# Patient Record
Sex: Female | Born: 1975 | Race: Black or African American | Hispanic: No | Smoking: Never smoker
Health system: Southern US, Community
[De-identification: ages and names within clinical notes are randomized; demographics above are authoritative.]

## PROBLEM LIST (undated history)

## (undated) DIAGNOSIS — I10 Essential (primary) hypertension: Secondary | ICD-10-CM

## (undated) DIAGNOSIS — A599 Trichomoniasis, unspecified: Secondary | ICD-10-CM

---

## 1998-05-24 ENCOUNTER — Emergency Department (HOSPITAL_COMMUNITY): Admission: EM | Admit: 1998-05-24 | Discharge: 1998-05-24 | Payer: Self-pay | Admitting: Emergency Medicine

## 1999-08-23 ENCOUNTER — Encounter: Payer: Self-pay | Admitting: Emergency Medicine

## 1999-08-23 ENCOUNTER — Emergency Department (HOSPITAL_COMMUNITY): Admission: EM | Admit: 1999-08-23 | Discharge: 1999-08-23 | Payer: Self-pay | Admitting: Emergency Medicine

## 2000-06-04 ENCOUNTER — Emergency Department (HOSPITAL_COMMUNITY): Admission: EM | Admit: 2000-06-04 | Discharge: 2000-06-04 | Payer: Self-pay | Admitting: Emergency Medicine

## 2001-03-18 ENCOUNTER — Emergency Department (HOSPITAL_COMMUNITY): Admission: EM | Admit: 2001-03-18 | Discharge: 2001-03-18 | Payer: Self-pay | Admitting: Emergency Medicine

## 2002-12-24 HISTORY — PX: DILATION AND CURETTAGE OF UTERUS: SHX78

## 2010-10-04 ENCOUNTER — Emergency Department (HOSPITAL_COMMUNITY)
Admission: EM | Admit: 2010-10-04 | Discharge: 2010-10-04 | Payer: Self-pay | Source: Home / Self Care | Admitting: Family Medicine

## 2010-10-24 ENCOUNTER — Emergency Department (HOSPITAL_COMMUNITY): Admission: EM | Admit: 2010-10-24 | Discharge: 2010-10-24 | Payer: Self-pay | Admitting: Emergency Medicine

## 2011-02-14 ENCOUNTER — Encounter: Payer: Self-pay | Admitting: Obstetrics and Gynecology

## 2011-06-02 ENCOUNTER — Emergency Department (HOSPITAL_COMMUNITY)
Admission: EM | Admit: 2011-06-02 | Discharge: 2011-06-02 | Disposition: A | Discharge status: Home / Self Care | Payer: PRIVATE HEALTH INSURANCE | Attending: Emergency Medicine | Admitting: Emergency Medicine

## 2011-06-02 DIAGNOSIS — M79609 Pain in unspecified limb: Secondary | ICD-10-CM | POA: Insufficient documentation

## 2011-06-02 DIAGNOSIS — IMO0002 Reserved for concepts with insufficient information to code with codable children: Secondary | ICD-10-CM | POA: Insufficient documentation

## 2011-06-02 DIAGNOSIS — E669 Obesity, unspecified: Secondary | ICD-10-CM | POA: Insufficient documentation

## 2011-06-05 ENCOUNTER — Inpatient Hospital Stay (INDEPENDENT_AMBULATORY_CARE_PROVIDER_SITE_OTHER)
Admission: RE | Admit: 2011-06-05 | Discharge: 2011-06-05 | Disposition: A | Discharge status: Home / Self Care | Payer: PRIVATE HEALTH INSURANCE | Source: Ambulatory Visit | Attending: Emergency Medicine | Admitting: Emergency Medicine

## 2011-06-05 DIAGNOSIS — IMO0002 Reserved for concepts with insufficient information to code with codable children: Secondary | ICD-10-CM

## 2011-08-20 ENCOUNTER — Inpatient Hospital Stay (INDEPENDENT_AMBULATORY_CARE_PROVIDER_SITE_OTHER)
Admission: RE | Admit: 2011-08-20 | Discharge: 2011-08-20 | Disposition: A | Discharge status: Home / Self Care | Payer: Self-pay | Source: Ambulatory Visit | Attending: Family Medicine | Admitting: Family Medicine

## 2011-08-20 DIAGNOSIS — J069 Acute upper respiratory infection, unspecified: Secondary | ICD-10-CM

## 2011-08-20 DIAGNOSIS — R05 Cough: Secondary | ICD-10-CM

## 2012-01-04 ENCOUNTER — Ambulatory Visit (INDEPENDENT_AMBULATORY_CARE_PROVIDER_SITE_OTHER): Payer: BC Managed Care – PPO | Admitting: General Surgery

## 2012-01-17 ENCOUNTER — Ambulatory Visit (INDEPENDENT_AMBULATORY_CARE_PROVIDER_SITE_OTHER): Payer: BC Managed Care – PPO | Admitting: General Surgery

## 2012-02-15 ENCOUNTER — Emergency Department (INDEPENDENT_AMBULATORY_CARE_PROVIDER_SITE_OTHER)
Admission: EM | Admit: 2012-02-15 | Discharge: 2012-02-15 | Disposition: A | Discharge status: Home Health | Payer: BC Managed Care – PPO | Source: Home / Self Care | Attending: Emergency Medicine | Admitting: Emergency Medicine

## 2012-02-15 ENCOUNTER — Encounter (HOSPITAL_COMMUNITY): Payer: Self-pay | Admitting: *Deleted

## 2012-02-15 DIAGNOSIS — R51 Headache: Secondary | ICD-10-CM

## 2012-02-15 DIAGNOSIS — H60399 Other infective otitis externa, unspecified ear: Secondary | ICD-10-CM

## 2012-02-15 DIAGNOSIS — H60391 Other infective otitis externa, right ear: Secondary | ICD-10-CM

## 2012-02-15 MED ORDER — ACETAMINOPHEN 325 MG PO TABS: 975.0000 mg | ORAL_TABLET | Freq: Once | ORAL | Status: DC

## 2012-02-15 MED ORDER — ACETAMINOPHEN 325 MG PO TABS
ORAL_TABLET | ORAL | Status: AC
  Filled 2012-02-15: qty 3

## 2012-02-15 MED ORDER — ACETAMINOPHEN-CODEINE #3 300-30 MG PO TABS: 1.0000 | ORAL_TABLET | Freq: Four times a day (QID) | ORAL | Status: AC | PRN

## 2012-02-15 MED ORDER — CIPROFLOXACIN-HYDROCORTISONE 0.2-1 % OT SUSP: 3.0000 [drp] | Freq: Two times a day (BID) | OTIC | Status: AC

## 2012-02-15 NOTE — ED Notes (Signed)
Pt is here with complaints of right ear pain and HA with onset last night.

## 2012-02-15 NOTE — ED Provider Notes (Signed)
History     CSN: [id number]  Arrival date & time 02/15/12  6213   First MD Initiated Contact with Patient 02/15/12 [phone]      Chief Complaint  Patient presents with  . Otalgia    (Consider location/radiation/quality/duration/timing/severity/associated sxs/prior treatment) HPI Comments: Patient presents today to urgent care complaining of right ear pain that started last night this morning woke up with a headache (global), feels like her ear is warm and is tender pupils are ear denies any injury to his or recent complaining that has been applying peroxide to her right ear canal.  Patient describes that last week she did have some cold-like symptoms consistent of cough and congestion currently with a mild improvement runny and stuffy nose. No fevers, no shortness of breath.  Patient denies any other symptoms such as numbness, tingling, sudden weakness of any extremity, visual changes.  Patient is a 36 y.o. female presenting with ear pain. The history is provided by the patient.  Otalgia This is a new problem. The current episode started yesterday. There is pain in the right ear. The problem occurs constantly. The problem has been gradually worsening. There has been no fever. Associated symptoms include headaches, rhinorrhea and cough. Pertinent negatives include no hearing loss, no sore throat, no abdominal pain and no neck pain. Her past medical history is significant for tympanostomy tube. Her past medical history does not include chronic ear infection or hearing loss.    History reviewed. No pertinent past medical history.  History reviewed. No pertinent past surgical history.  History reviewed. No pertinent family history.  History  Substance Use Topics  . Smoking status: Never Smoker   . Smokeless tobacco: Not on file  . Alcohol Use: No    OB History    Grav Para Term Preterm Abortions TAB SAB Ect Mult Living                  Review of Systems  Constitutional: Negative  for fever, appetite change and fatigue.  HENT: Positive for ear pain, congestion and rhinorrhea. Negative for hearing loss, sore throat and neck pain.   Eyes: Negative for pain.  Respiratory: Positive for cough.   Gastrointestinal: Negative for abdominal pain.  Neurological: Positive for headaches. Negative for dizziness, facial asymmetry, weakness, light-headedness and numbness.    Allergies  Review of patient's allergies indicates no known allergies.  Home Medications   Current Outpatient Rx  Name Route Sig Dispense Refill  . ACETAMINOPHEN-CODEINE #3 300-30 MG PO TABS Oral Take 1-2 tablets by mouth every 6 (six) hours as needed for pain. 15 tablet 0  . CIPROFLOXACIN-HYDROCORTISONE 0.2-1 % OT SUSP Right Ear Place 3 drops into the right ear 2 (two) times daily. 10 mL 0    BP 143/82  Pulse 80  Temp(Src) 98.4 F (36.9 C) (Oral)  Resp 16  SpO2 99%  Physical Exam  Nursing note and vitals reviewed. Constitutional: She appears well-developed and well-nourished. No distress.  HENT:  Head: Normocephalic.  Right Ear: Tympanic membrane normal. No lacerations. There is drainage and tenderness. No foreign bodies. No mastoid tenderness. Tympanic membrane is not injected and not bulging.  Left Ear: Hearing, tympanic membrane, external ear and ear canal normal.  Eyes: Conjunctivae are normal. Right eye exhibits no discharge. Left eye exhibits no discharge.  Neck: Neck supple. No JVD present.  Cardiovascular:  No murmur heard. Pulmonary/Chest: Effort normal. She has no wheezes.  Abdominal: Soft.  Lymphadenopathy:    She has no cervical adenopathy.  Neurological: She is alert.  Skin: No rash noted.    ED Course  Procedures (including critical care time)  Labs Reviewed - No data to display No results found.   1. Infection of ear, external, right   2. Headache       MDM   Right ear discomfort for less than 24 hours with an associated headache. Afebrile denies ear canal  injured. The lateral tympanic membranes looked unremarkable. Right ear canal with active exudates tender with traction.       Jimmie Molly, MD 02/15/12 [phone]

## 2012-02-15 NOTE — Discharge Instructions (Signed)
Otitis Externa Otitis externa ("swimmer's ear") is a germ (bacterial) or fungal infection of the outer ear canal (from the eardrum to the outside of the ear). Swimming in dirty water may cause swimmer's ear. It also may be caused by moisture in the ear from water remaining after swimming or bathing. Often the first signs of infection may be itching in the ear canal. This may progress to ear canal swelling, redness, and pus drainage, which may be signs of infection. HOME CARE INSTRUCTIONS   Apply the antibiotic drops to the ear canal as prescribed by your doctor.   This can be a very painful medical condition. A strong pain reliever may be prescribed.   Only take over-the-counter or prescription medicines for pain, discomfort, or fever as directed by your caregiver.   If your caregiver has given you a follow-up appointment, it is very important to keep that appointment. Not keeping the appointment could result in a chronic or permanent injury, pain, hearing loss and disability. If there is any problem keeping the appointment, you must call back to this facility for assistance.  PREVENTION   It is important to keep your ear dry. Use the corner of a towel to wick water out of the ear canal after swimming or bathing.   Avoid scratching in your ear. This can damage the ear canal or remove the protective wax lining the canal and make it easier for germs (bacteria) or a fungus to grow.   You may use ear drops made of rubbing alcohol and vinegar after swimming to prevent future "swimmer's ear" infections. Make up a small bottle of equal parts white vinegar and alcohol. Put 3 or 4 drops into each ear after swimming.   Avoid swimming in lakes, polluted water, or poorly chlorinated pools.  SEEK MEDICAL CARE IF:   An oral temperature above 102 F (38.9 C) develops.   Your ear is still painful after 3 days and shows signs of getting worse (redness, swelling, pain, or pus).  MAKE SURE YOU:   Understand  these instructions.   Will watch your condition.   Will get help right away if you are not doing well or get worse.  Document Released: 12/10/2005 Document Revised: 08/22/2011 Document Reviewed: 07/16/2008 [employer] Patient Information 2012 [employer]. 

## 2012-02-20 ENCOUNTER — Ambulatory Visit (INDEPENDENT_AMBULATORY_CARE_PROVIDER_SITE_OTHER): Payer: BC Managed Care – PPO | Admitting: General Surgery

## 2012-02-29 ENCOUNTER — Encounter (INDEPENDENT_AMBULATORY_CARE_PROVIDER_SITE_OTHER): Payer: Self-pay | Admitting: General Surgery

## 2012-03-18 ENCOUNTER — Emergency Department (HOSPITAL_COMMUNITY)
Admission: EM | Admit: 2012-03-18 | Discharge: 2012-03-18 | Disposition: A | Discharge status: Home / Self Care | Payer: BC Managed Care – PPO | Source: Home / Self Care

## 2012-03-18 ENCOUNTER — Encounter (HOSPITAL_COMMUNITY): Payer: Self-pay | Admitting: Emergency Medicine

## 2012-03-18 DIAGNOSIS — M546 Pain in thoracic spine: Secondary | ICD-10-CM

## 2012-03-18 MED ORDER — METHOCARBAMOL 750 MG PO TABS: 750.0000 mg | ORAL_TABLET | Freq: Four times a day (QID) | ORAL | Status: AC

## 2012-03-18 MED ORDER — IBUPROFEN 800 MG PO TABS: 800.0000 mg | ORAL_TABLET | Freq: Three times a day (TID) | ORAL | Status: AC

## 2012-03-18 NOTE — Discharge Instructions (Signed)
Take medications as prescribed. Use warm compresses to back 3-4 times a day, or you may use the stick on pads, such as ThermaCare. Follow up with Dr Dorothyann Gibbs if symptoms are not improving in the next  4-5 days.   Back Pain, Adult Back pain is very common. The pain often gets better over time. The cause of back pain is usually not dangerous. Most people can learn to manage their back pain on their own.  HOME CARE   Stay active. Start with short walks on flat ground if you can. Try to walk farther each day.   Do not sit, drive, or stand in one place for more than 30 minutes. Do not stay in bed.   Do not avoid exercise or work. Activity can help your back heal faster.   Be careful when you bend or lift an object. Bend at your knees, keep the object close to you, and do not twist.   Sleep on a firm mattress. Lie on your side, and bend your knees. If you lie on your back, put a pillow under your knees.   Only take medicines as told by your doctor.   Put ice on the injured area.   Put ice in a plastic bag.   Place a towel between your skin and the bag.   Leave the ice on for 15 to 20 minutes, 3 to 4 times a day for the first 2 to 3 days. After that, you can switch between ice and heat packs.   Ask your doctor about back exercises or massage.   Avoid feeling anxious or stressed. Find good ways to deal with stress, such as exercise.  GET HELP RIGHT AWAY IF:   Your pain does not go away with rest or medicine.   Your pain does not go away in 1 week.   You have new problems.   You do not feel well.   The pain spreads into your legs.   You cannot control when you poop (bowel movement) or pee (urinate).   Your arms or legs feel weak or lose feeling (numbness).   You feel sick to your stomach (nauseous) or throw up (vomit).   You have belly (abdominal) pain.   You feel like you may pass out (faint).  MAKE SURE YOU:   Understand these instructions.   Will watch your condition.    Will get help right away if you are not doing well or get worse.  Document Released: 05/28/2008 Document Revised: 11/29/2011 Document Reviewed: 04/30/2011 [facility] Patient Information 2012 [place], [place].

## 2012-03-18 NOTE — ED Notes (Signed)
Instructed to undress and place on gown

## 2012-03-18 NOTE — ED Notes (Signed)
C/o back pain onset 03/16/2012. C/o neck, upper back, down to mid back .  Center back pain.  No known injury.  Denies coughing or vomiting.  Patient reports stiffness.  Denies numbness or tingling in arms

## 2012-03-18 NOTE — ED Provider Notes (Signed)
History     CSN: [id number]  Arrival date & time 03/18/12  1000   None     Chief Complaint  Patient presents with  . Back Pain    (Consider location/radiation/quality/duration/timing/severity/associated sxs/prior treatment) HPI Comments: Patient presents today with complaints of thoracic back pain for 2-3 days. She states the pain is midline and does not radiate. She points from T2 to inferior scapula area. Pain seems to worsen when she is standing. No worsening with movement of her neck or upper extremities. No numbness or tingling. She has not tried any over-the-counter pain relievers, ice or heat for her discomfort. She states that she had similar pain approximately 2 months ago which lasted for a little over a week and then resolved on its own. Patient states that she did move a patient's bed at work in a couple of days before symptom onset but no other aggravating activities or trauma.   History reviewed. No pertinent past medical history.  History reviewed. No pertinent past surgical history.  History reviewed. No pertinent family history.  History  Substance Use Topics  . Smoking status: Never Smoker   . Smokeless tobacco: Not on file  . Alcohol Use: No    OB History    Grav Para Term Preterm Abortions TAB SAB Ect Mult Living                  Review of Systems  Constitutional: Negative for fever and chills.  HENT: Negative for neck pain and neck stiffness.   Respiratory: Negative for cough and shortness of breath.   Cardiovascular: Negative for chest pain.  Musculoskeletal: Positive for back pain.  Neurological: Negative for numbness.    Allergies  Review of patient's allergies indicates no known allergies.  Home Medications   Current Outpatient Rx  Name Route Sig Dispense Refill  . IBUPROFEN 800 MG PO TABS Oral Take 1 tablet (800 mg total) by mouth 3 (three) times daily. 15 tablet 0  . METHOCARBAMOL 750 MG PO TABS Oral Take 1 tablet (750 mg total) by  mouth 4 (four) times daily. 20 tablet 0    BP 166/75  Pulse 79  Temp(Src) 97.8 F (36.6 C) (Oral)  Resp 16  SpO2 100%  LMP 03/02/2012  Physical Exam  Nursing note and vitals reviewed. Constitutional: She appears well-developed and well-nourished. No distress.  HENT:  Head: Normocephalic and atraumatic.  Cardiovascular: Normal rate, regular rhythm and normal heart sounds.   Pulses:      Radial pulses are 2+ on the right side, and 2+ on the left side.  Pulmonary/Chest: Effort normal and breath sounds normal. No respiratory distress.  Musculoskeletal:       Right shoulder: She exhibits normal range of motion.       Left shoulder: She exhibits normal range of motion.       Cervical back: Normal.       Thoracic back: Normal.  Neurological: She is alert. She has normal strength.  Reflex Scores:      Bicep reflexes are 2+ on the right side and 2+ on the left side. Skin: Skin is warm and dry. No rash noted.  Psychiatric: She has a normal mood and affect.    ED Course  Procedures (including critical care time)  Labs Reviewed - No data to display No results found.   1. Acute thoracic back pain       MDM  Thoracic back pain x 2-3 days. Exam neg.  Melody Comas, PA 03/18/12 [address] [place], [place] 03/18/12 1221

## 2012-03-24 NOTE — ED Provider Notes (Signed)
Medical screening examination/treatment/procedure(s) were performed by resident physician or non-physician practitioner and as supervising physician I was immediately available for consultation/collaboration.   Neviah Braud DOUGLAS MD.    Master Touchet D Venna Berberich, MD 03/24/12 1520 

## 2012-07-28 ENCOUNTER — Emergency Department (HOSPITAL_COMMUNITY)
Admission: EM | Admit: 2012-07-28 | Discharge: 2012-07-28 | Disposition: A | Discharge status: Home / Self Care | Payer: Self-pay | Attending: Emergency Medicine | Admitting: Emergency Medicine

## 2012-07-28 ENCOUNTER — Encounter (HOSPITAL_COMMUNITY): Payer: Self-pay | Admitting: Emergency Medicine

## 2012-07-28 DIAGNOSIS — A599 Trichomoniasis, unspecified: Secondary | ICD-10-CM | POA: Insufficient documentation

## 2012-07-28 LAB — CBC WITH DIFFERENTIAL/PLATELET
Basophils Absolute: 0 10*3/uL (ref 0.0–0.1)
Basophils Relative: 0 % (ref 0–1)
MCHC: 33 g/dL (ref 30.0–36.0)
Neutro Abs: 4.3 10*3/uL (ref 1.7–7.7)
Neutrophils Relative %: 60 % (ref 43–77)
RDW: 14.5 % (ref 11.5–15.5)

## 2012-07-28 LAB — URINALYSIS, ROUTINE W REFLEX MICROSCOPIC
Ketones, ur: NEGATIVE mg/dL
Nitrite: NEGATIVE
Protein, ur: NEGATIVE mg/dL
pH: 5.5 (ref 5.0–8.0)

## 2012-07-28 LAB — URINE MICROSCOPIC-ADD ON

## 2012-07-28 LAB — WET PREP, GENITAL

## 2012-07-28 MED ORDER — HYDROCODONE-ACETAMINOPHEN 5-325 MG PO TABS
2.0000 | ORAL_TABLET | Freq: Once | ORAL | Status: AC
  Administered 2012-07-28: 2 via ORAL
  Filled 2012-07-28: qty 2

## 2012-07-28 MED ORDER — CEFTRIAXONE SODIUM 250 MG IJ SOLR
250.0000 mg | Freq: Once | INTRAMUSCULAR | Status: AC
  Administered 2012-07-28: 250 mg via INTRAMUSCULAR
  Filled 2012-07-28: qty 250

## 2012-07-28 MED ORDER — METRONIDAZOLE 500 MG PO TABS
2000.0000 mg | ORAL_TABLET | Freq: Once | ORAL | Status: AC
  Administered 2012-07-28: 2000 mg via ORAL
  Filled 2012-07-28: qty 4

## 2012-07-28 MED ORDER — AZITHROMYCIN 1 G PO PACK
1.0000 g | PACK | Freq: Once | ORAL | Status: AC
  Administered 2012-07-28: 1 g via ORAL
  Filled 2012-07-28: qty 1

## 2012-07-28 NOTE — ED Notes (Signed)
LLQ pain started on Saturday and gradually worsened with pain radiating across lower abdomen. Denies n/v/d, fever. Last BM this AM. Denies pain or difficulty with urination. LMP 07/04/12

## 2012-07-28 NOTE — ED Provider Notes (Signed)
History     CSN: [id number]  Arrival date & time 07/28/12  4098   First MD Initiated Contact with Patient 07/28/12 [phone]      Chief Complaint  Patient presents with  . Abdominal Pain    (Consider location/radiation/quality/duration/timing/severity/associated sxs/prior treatment) HPI Comments: Christina Nicholson 36 y.o. female   The chief complaint is: Patient presents with:   Abdominal Pain   The patient has medical history significant for:   History reviewed. No pertinent past medical history.  Patient presents with LLQ that began Saturday. She rates the pain 10/10 at its worse and 8/10 at the present moment. Patient states the pain is worse with walking. She has not tried any OTC treatments for pain. Patient is nulliparous and her LMP was 07/04/12. Denies fever, chills, night sweats. Denies NVD. Denies dysuria, urgency, or vaginal odor. Last sexual contact one month ago.      Patient is a 36 y.o. female presenting with abdominal pain. The history is provided by the patient.  Abdominal Pain The primary symptoms of the illness include abdominal pain. The primary symptoms of the illness do not include fever, nausea, vomiting, diarrhea, dysuria, vaginal discharge or vaginal bleeding.  Symptoms associated with the illness do not include chills, constipation, urgency or frequency.    History reviewed. No pertinent past medical history.  Past Surgical History  Procedure Date  . Dilation and curettage of uterus 2004    No family history on file.  History  Substance Use Topics  . Smoking status: Never Smoker   . Smokeless tobacco: Not on file  . Alcohol Use: No    OB History    Grav Para Term Preterm Abortions TAB SAB Ect Mult Living                  Review of Systems  Constitutional: Negative for fever, chills and activity change.  Gastrointestinal: Positive for abdominal pain. Negative for nausea, vomiting, diarrhea, constipation and blood in stool.    Genitourinary: Positive for vaginal pain and menstrual problem. Negative for dysuria, urgency, frequency, vaginal bleeding, vaginal discharge and dyspareunia.       Irregular periods.    Allergies  Review of patient's allergies indicates no known allergies.  Home Medications  No current outpatient prescriptions on file.  BP 128/93  Pulse 87  Temp 97.8 F (36.6 C) (Oral)  Resp 18  Ht 5\' 6"  (1.676 m)  Wt 255 lb (115.667 kg)  BMI 41.16 kg/m2  SpO2 100%  LMP 07/04/2012  Physical Exam  Nursing note and vitals reviewed. Constitutional: She appears well-developed and well-nourished.  HENT:  Head: Normocephalic and atraumatic.  Mouth/Throat: Oropharynx is clear and moist. No oropharyngeal exudate.  Cardiovascular: Normal rate, regular rhythm and normal heart sounds.   Abdominal: Soft. Bowel sounds are normal. There is tenderness. There is no rebound and no guarding.       Tenderness noted on palpation of LLQ  Genitourinary: Cervix exhibits motion tenderness and discharge. Right adnexum displays no tenderness. Left adnexum displays no tenderness. Vaginal discharge found.  Neurological: She is alert.  Skin: Skin is warm and dry.    ED Course  Procedures (including critical care time)  Labs Reviewed  URINALYSIS, ROUTINE W REFLEX MICROSCOPIC - Abnormal; Notable for the following:    APPearance CLOUDY (*)     Leukocytes, UA MODERATE (*)     All other components within normal limits  CBC WITH DIFFERENTIAL - Abnormal; Notable for the following:    RBC 5.13 (*)  All other components within normal limits  URINE MICROSCOPIC-ADD ON - Abnormal; Notable for the following:    Squamous Epithelial / LPF FEW (*)     All other components within normal limits  POCT PREGNANCY, URINE  WET PREP, GENITAL  GC/CHLAMYDIA PROBE AMP, GENITAL   No results found. Results for orders placed during the hospital encounter of 07/28/12  URINALYSIS, ROUTINE W REFLEX MICROSCOPIC      Component Value  Range   Color, Urine YELLOW  YELLOW   APPearance CLOUDY (*) CLEAR   Specific Gravity, Urine 1.023  1.005 - 1.030   pH 5.5  5.0 - 8.0   Glucose, UA NEGATIVE  NEGATIVE mg/dL   Hgb urine dipstick NEGATIVE  NEGATIVE   Bilirubin Urine NEGATIVE  NEGATIVE   Ketones, ur NEGATIVE  NEGATIVE mg/dL   Protein, ur NEGATIVE  NEGATIVE mg/dL   Urobilinogen, UA 0.2  0.0 - 1.0 mg/dL   Nitrite NEGATIVE  NEGATIVE   Leukocytes, UA MODERATE (*) NEGATIVE  CBC WITH DIFFERENTIAL      Component Value Range   WBC 7.2  4.0 - 10.5 K/uL   RBC 5.13 (*) 3.87 - 5.11 MIL/uL   Hemoglobin 13.6  12.0 - 15.0 g/dL   HCT 16.1  09.6 - 04.5 %   MCV 80.3  78.0 - 100.0 fL   MCH 26.5  26.0 - 34.0 pg   MCHC 33.0  30.0 - 36.0 g/dL   RDW 40.9  81.1 - 91.4 %   Platelets 184  150 - 400 K/uL   Neutrophils Relative 60  43 - 77 %   Neutro Abs 4.3  1.7 - 7.7 K/uL   Lymphocytes Relative 33  12 - 46 %   Lymphs Abs 2.4  0.7 - 4.0 K/uL   Monocytes Relative 5  3 - 12 %   Monocytes Absolute 0.4  0.1 - 1.0 K/uL   Eosinophils Relative 1  0 - 5 %   Eosinophils Absolute 0.1  0.0 - 0.7 K/uL   Basophils Relative 0  0 - 1 %   Basophils Absolute 0.0  0.0 - 0.1 K/uL  WET PREP, GENITAL      Component Value Range   Yeast Wet Prep HPF POC NONE SEEN  NONE SEEN   Trich, Wet Prep FEW (*) NONE SEEN   Clue Cells Wet Prep HPF POC FEW (*) NONE SEEN   WBC, Wet Prep HPF POC FEW (*) NONE SEEN  POCT PREGNANCY, URINE      Component Value Range   Preg Test, Ur NEGATIVE  NEGATIVE  URINE MICROSCOPIC-ADD ON      Component Value Range   Squamous Epithelial / LPF FEW (*) RARE   WBC, UA 7-10  <3 WBC/hpf   RBC / HPF 0-2  <3 RBC/hpf   Bacteria, UA RARE  RARE   Urine-Other TRICHOMONAS PRESENT        1. Trichimoniasis       MDM  Patient presented with LLQ pain that has been present since Saturday. Urine pregnancy test negative: Urine: remarkable for trichomonas. Wet prep: remarkable for trichomonas. CBC: unremarkable. Patient states that last sexual  activity was with her husband one month ago. No adnexal tenderness noted. Due to cervical motion tenderness patient will be treated empirically for GC/Chlamydia(culture pending), in addition to treatment for Trichomonas. Rocephin, Azithromycin, and Flagyl given in the ED. This plan discussed and agreed upon per Dr. Radford Pax. Patient educated about treatment of partner. Patient has no red flags for tuboovarian  abscess, PID, or ovarian torsion. Return precautions given verbally and in discharge instructions.         Pixie Casino, PA-C 07/28/12 1725

## 2012-07-29 NOTE — ED Provider Notes (Signed)
Medical screening examination/treatment/procedure(s) were performed by non-physician practitioner and as supervising physician I was immediately available for consultation/collaboration.    Nelia Shi, MD 07/29/12 0000

## 2014-05-11 ENCOUNTER — Inpatient Hospital Stay (HOSPITAL_COMMUNITY)
Admission: AD | Admit: 2014-05-11 | Discharge: 2014-05-11 | Disposition: A | Discharge status: Home / Self Care | Payer: 59 | Source: Ambulatory Visit | Attending: Family Medicine | Admitting: Family Medicine

## 2014-05-11 ENCOUNTER — Encounter (HOSPITAL_COMMUNITY): Payer: Self-pay | Admitting: *Deleted

## 2014-05-11 DIAGNOSIS — N926 Irregular menstruation, unspecified: Secondary | ICD-10-CM | POA: Insufficient documentation

## 2014-05-11 DIAGNOSIS — R109 Unspecified abdominal pain: Secondary | ICD-10-CM | POA: Insufficient documentation

## 2014-05-11 DIAGNOSIS — R030 Elevated blood-pressure reading, without diagnosis of hypertension: Secondary | ICD-10-CM | POA: Insufficient documentation

## 2014-05-11 DIAGNOSIS — N898 Other specified noninflammatory disorders of vagina: Secondary | ICD-10-CM | POA: Insufficient documentation

## 2014-05-11 HISTORY — DX: Trichomoniasis, unspecified: A59.9

## 2014-05-11 LAB — URINE MICROSCOPIC-ADD ON

## 2014-05-11 LAB — POCT PREGNANCY, URINE: Preg Test, Ur: NEGATIVE

## 2014-05-11 LAB — URINALYSIS, ROUTINE W REFLEX MICROSCOPIC
BILIRUBIN URINE: NEGATIVE
GLUCOSE, UA: NEGATIVE mg/dL
KETONES UR: NEGATIVE mg/dL
Leukocytes, UA: NEGATIVE
NITRITE: NEGATIVE
PH: 6 (ref 5.0–8.0)
Protein, ur: NEGATIVE mg/dL
SPECIFIC GRAVITY, URINE: 1.025 (ref 1.005–1.030)
Urobilinogen, UA: 0.2 mg/dL (ref 0.0–1.0)

## 2014-05-11 LAB — WET PREP, GENITAL
CLUE CELLS WET PREP: NONE SEEN
TRICH WET PREP: NONE SEEN
YEAST WET PREP: NONE SEEN

## 2014-05-11 NOTE — MAU Note (Signed)
Been cramping like she is on her period, but it hasn't come on.  Started a couple days ago..  Having a darkish reddish brown d/c- started about the same time.

## 2014-05-11 NOTE — MAU Provider Note (Signed)
Attestation of Attending Supervision of Advanced Practitioner (PA/CNM/NP): Evaluation and management procedures were performed by the Advanced Practitioner under my supervision and collaboration.  I have reviewed the Advanced Practitioner's note and chart, and I agree with the management and plan.  Landin Tallon S Sylas Twombly, MD [facility] Attending 05/11/2014 11:22 AM   

## 2014-05-11 NOTE — MAU Provider Note (Signed)
Chief Complaint: Abdominal Cramping  @MAUPATCONTACT @ SUBJECTIVE HPI: Christina Nicholson is a 38 y.o. G0P0who presents with cramping and vaginal discharg/bleeding x2 days. Pt says that 2 days ago she noticed dark red/brown discharge from her vagina when she wipes after using the bathroom. This has been accompanied by lower abdominal pain/cramping which she scores a 7/10. Pt has irregular menstruation and her LMP was 2 months ago. She is sexually active with one partner but uses no form of contraception. Last time pt experienced similar symptoms was about a year ago which she says was a trichomonas infection. Pt acknowledges urinary urgency. Denies n/v/d, constipation, fever, dysuria, hematuria, odorous discharge. Last bowel movement this morning.    Past Medical History  Diagnosis Date  . Trichomonas infection    OB History  Gravida Para Term Preterm AB SAB TAB Ectopic Multiple Living  0         0       Past Surgical History  Procedure Laterality Date  . Dilation and curettage of uterus  2004   History   Social History  . Marital Status: Legally Separated    Spouse Name: N/A    Number of Children: N/A  . Years of Education: N/A   Occupational History  . Not on file.   Social History Main Topics  . Smoking status: Never Smoker   . Smokeless tobacco: Not on file  . Alcohol Use: No  . Drug Use: No  . Sexual Activity: Yes    Birth Control/ Protection: None   Other Topics Concern  . Not on file   Social History Narrative  . No narrative on file   No current facility-administered medications on file prior to encounter.   No current outpatient prescriptions on file prior to encounter.   No Known Allergies  ROS: Pertinent items in HPI  OBJECTIVE Blood pressure 151/87, pulse 80, temperature 98.4 F (36.9 C), temperature source Oral, resp. rate 18, height 5' 5.5" (1.664 m), weight 127.461 kg (281 lb). GENERAL: Well-developed, well-nourished female in no acute distress.   HEENT: Normocephalic HEART: normal rate RESP: normal effort ABDOMEN: Soft, non-tender EXTREMITIES: Nontender, no edema NEURO: Alert and oriented Speculum exam: Vagina - Small amount of brown discharge, no odor Cervix - No contact bleeding Bimanual exam: Cervix closed, no CMT,   Uterus non tender, normal size Adnexa non tender, no masses bilaterally GC/Chlam, wet prep done Chaperone present for exam.   LAB RESULTS Results for orders placed during the hospital encounter of 05/11/14 (from the past 24 hour(s))  URINALYSIS, ROUTINE W REFLEX MICROSCOPIC     Status: Abnormal   Collection Time    05/11/14  9:13 AM      Result Value Ref Range   Color, Urine YELLOW  YELLOW   APPearance CLEAR  CLEAR   Specific Gravity, Urine 1.025  1.005 - 1.030   pH 6.0  5.0 - 8.0   Glucose, UA NEGATIVE  NEGATIVE mg/dL   Hgb urine dipstick MODERATE (*) NEGATIVE   Bilirubin Urine NEGATIVE  NEGATIVE   Ketones, ur NEGATIVE  NEGATIVE mg/dL   Protein, ur NEGATIVE  NEGATIVE mg/dL   Urobilinogen, UA 0.2  0.0 - 1.0 mg/dL   Nitrite NEGATIVE  NEGATIVE   Leukocytes, UA NEGATIVE  NEGATIVE  URINE MICROSCOPIC-ADD ON     Status: Abnormal   Collection Time    05/11/14  9:13 AM      Result Value Ref Range   Squamous Epithelial / LPF FEW (*) RARE  RBC / HPF 0-2  <3 RBC/hpf   Bacteria, UA RARE  RARE  POCT PREGNANCY, URINE     Status: None   Collection Time    05/11/14  9:21 AM      Result Value Ref Range   Preg Test, Ur NEGATIVE  NEGATIVE  WET PREP, GENITAL     Status: Abnormal   Collection Time    05/11/14  9:52 AM      Result Value Ref Range   Yeast Wet Prep HPF POC NONE SEEN  NONE SEEN   Trich, Wet Prep NONE SEEN  NONE SEEN   Clue Cells Wet Prep HPF POC NONE SEEN  NONE SEEN   WBC, Wet Prep HPF POC FEW (*) NONE SEEN    IMAGING No results found.  MAU COURSE Urine pregnancy-negative UA Wet prep GC  ASSESSMENT AND PLAN  A:  Vaginal discharge; no sign of infection  Abdominal  cramping Elevated BP  P:  Discharge home in stable condition Ok to take ibuprofen as directed on the bottle Follow up with PCP for elevated BP Warning signs discussed for signs of stroke/ heart attack. If patient endorses any of these symptoms [facility] is [facility] Condoms always      Medication List    Notice   You have not been prescribed any medications.       Lawernce Pittsrew Venables, Student-PA 05/11/2014  9:41 AM  Evaluation and management procedures were performed by the PA student under my supervision and collaboration. I have reviewed the note and chart, and I agree with the management and plan.  Iona HansenJennifer Irene Ladislav Caselli, NP 05/11/2014 10:29 AM

## 2014-05-12 LAB — GC/CHLAMYDIA PROBE AMP
CT PROBE, AMP APTIMA: NEGATIVE
GC PROBE AMP APTIMA: NEGATIVE

## 2014-11-05 ENCOUNTER — Ambulatory Visit: Payer: 59 | Admitting: *Deleted

## 2014-11-09 ENCOUNTER — Encounter: Payer: 59 | Attending: Obstetrics and Gynecology | Admitting: *Deleted

## 2014-11-09 ENCOUNTER — Encounter: Payer: Self-pay | Admitting: *Deleted

## 2014-11-09 VITALS — Wt 290.0 lb

## 2014-11-09 DIAGNOSIS — E669 Obesity, unspecified: Secondary | ICD-10-CM | POA: Insufficient documentation

## 2014-11-09 DIAGNOSIS — Z6841 Body Mass Index (BMI) 40.0 and over, adult: Secondary | ICD-10-CM | POA: Insufficient documentation

## 2014-11-09 DIAGNOSIS — R7309 Other abnormal glucose: Secondary | ICD-10-CM | POA: Diagnosis not present

## 2014-11-09 DIAGNOSIS — R7303 Prediabetes: Secondary | ICD-10-CM

## 2014-11-09 DIAGNOSIS — Z713 Dietary counseling and surveillance: Secondary | ICD-10-CM | POA: Insufficient documentation

## 2014-11-09 NOTE — Patient Instructions (Signed)
Aim for 3 meals/day and avoid meal skipping Follow recommendations for meals: small carbohydrates, low fat protein, more vegetables Use snack handout Switch drinks to unsweet tea with Splenda or water with Crystal Light Aim to walk on treadmill 30 minutes each day

## 2014-11-09 NOTE — Progress Notes (Signed)
Medical Nutrition Therapy:  Appt start time: 1130 end time:  1230.  Assessment:  Primary concerns today: [name] is here for nutrition counseling pertaining to obesity and prediabetes.  She was prescribed metformin 500 mg TID, but she has not been taking it because it causes diarrhea. [name] states she has always been heavy.  Her lowest weight as an adult was 160-180 pounds and her highest weight is 288 pounds.  Her most consistent weight is 230.  She would like to weigh 160 pounds.   She has attempted to lose weight in the past though medically assisted weight loss, medication (Phentermine).  She never participated in any organized diet programs.  She has exercised in the past. She reports the medication helped, but she stopped taking it.  She doesn't like to drink water, but noticed Phentermine made her thirsty. [name] lives with her friend and they share the grocery shopping responsibilities.  [name] does the cooking and she states she fries most often.  She states she eats out a lot: 4 days/weeks ([employer], [employer], [employer]). When at home she eats at the kitchen table while on her phone. She thinks it takes her 10 minutes to eat She reports irregular menstrual cycles and infertility.  She also presents with hirsutism.  Lab work is not indicative of PCOS, although this does run in her family She reports poor sleep hygeine  Her diet quality is quite poor.  She routinely skips meals.  Her dietary choices are high in saturated and trans fats and her beverages are always high in sugars.  She eats very little fruits or vegetables, and she is inactive.     Preferred Learning Style:  Auditory  Visual   Learning Readiness:   Ready, per patient   MEDICATIONS: see list. Not taking Metformin as prescribed   DIETARY INTAKE:  Usual eating pattern includes 1-3 meals and 1-4 snacks per day.  Everyday foods include energy-dense proteins and starches.  Avoided foods include none.     24-hr recall:  B ( AM): sometimes chips, cookies, juice  Snk ( AM): chips, other "junk food"  L ( PM): cheeseburger from work or sometimes salad Snk ( PM): chips, other "junk food"  D ( PM): pizza, [employer] Snk ( PM): chips, other "junk food"  Beverages: tea, soda  Usual physical activity: none outside ADLs.  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   Nutritional Diagnosis:  [place]-2.1 Inpaired nutrition utilization As related to carbohydrates.  As evidenced by A1c of 6.0%.    Intervention:  Nutrition counseling provided.  Discussed physiology of diabetes briefly and role of both genetics and lifestyle choices on development of diabetes.  She has strong family history of diabetes, and possible insulin resistance from PCOS-like lab values.  Encouraged her to focus on what she can change and that is her lifestyle choices. Encouraged 3 meals/day based on MyPlate recommendations.  Focused on lean proteins, decrease carbohydrates, and increased vegetables.  Discussed healthier snack options, reducing sugary beverages, and increasing physical activity  Goals: Aim for 3 meals/day and avoid meal skipping Follow recommendations for meals: small carbohydrates, low fat protein, more vegetables Use snack handout Switch drinks to unsweet tea with Splenda or water with Crystal Light Aim to walk on treadmill 30 minutes each day   Teaching Method Utilized:  Visual Auditory  Handouts given during visit include:  15g carb snack  sheet  Barriers to learning/adherence to lifestyle change: motivation to change  Demonstrated degree of understanding via:  Teach Back   Monitoring/Evaluation:  Dietary intake, exercise, and body weight in 4-6 week(s).

## 2014-12-21 ENCOUNTER — Ambulatory Visit: Payer: 59 | Admitting: *Deleted

## 2015-04-26 ENCOUNTER — Ambulatory Visit: Payer: Worker's Compensation

## 2015-04-26 ENCOUNTER — Ambulatory Visit (INDEPENDENT_AMBULATORY_CARE_PROVIDER_SITE_OTHER): Payer: Worker's Compensation | Admitting: Emergency Medicine

## 2015-04-26 VITALS — BP 158/72 | HR 67 | Temp 97.9°F | Resp 16 | Ht 69.0 in | Wt 289.0 lb

## 2015-04-26 DIAGNOSIS — M25512 Pain in left shoulder: Secondary | ICD-10-CM

## 2015-04-26 DIAGNOSIS — M545 Low back pain, unspecified: Secondary | ICD-10-CM

## 2015-04-26 DIAGNOSIS — M25561 Pain in right knee: Secondary | ICD-10-CM

## 2015-04-26 DIAGNOSIS — W010XXA Fall on same level from slipping, tripping and stumbling without subsequent striking against object, initial encounter: Secondary | ICD-10-CM | POA: Diagnosis not present

## 2015-04-26 DIAGNOSIS — S39012A Strain of muscle, fascia and tendon of lower back, initial encounter: Secondary | ICD-10-CM | POA: Diagnosis not present

## 2015-04-26 DIAGNOSIS — M898X1 Other specified disorders of bone, shoulder: Secondary | ICD-10-CM

## 2015-04-26 DIAGNOSIS — M6248 Contracture of muscle, other site: Secondary | ICD-10-CM | POA: Diagnosis not present

## 2015-04-26 DIAGNOSIS — M62838 Other muscle spasm: Secondary | ICD-10-CM

## 2015-04-26 DIAGNOSIS — M62830 Muscle spasm of back: Secondary | ICD-10-CM

## 2015-04-26 LAB — POCT URINE PREGNANCY: Preg Test, Ur: NEGATIVE

## 2015-04-26 MED ORDER — MELOXICAM 15 MG PO TABS: 15.0000 mg | ORAL_TABLET | Freq: Every day | ORAL | Status: DC

## 2015-04-26 MED ORDER — CYCLOBENZAPRINE HCL 10 MG PO TABS: 10.0000 mg | ORAL_TABLET | Freq: Three times a day (TID) | ORAL | Status: DC | PRN

## 2015-04-26 NOTE — Patient Instructions (Signed)
Back Pain, Adult °Low back pain is very common. About 1 in 5 people have back pain. The cause of low back pain is rarely dangerous. The pain often gets better over time. About half of people with a sudden onset of back pain feel better in just 2 weeks. About 8 in 10 people feel better by 6 weeks.  °CAUSES °Some common causes of back pain include: °· Strain of the muscles or ligaments supporting the spine. °· Wear and tear (degeneration) of the spinal discs. °· Arthritis. °· Direct injury to the back. °DIAGNOSIS °Most of the time, the direct cause of low back pain is not known. However, back pain can be treated effectively even when the exact cause of the pain is unknown. Answering your caregiver's questions about your overall health and symptoms is one of the most accurate ways to make sure the cause of your pain is not dangerous. If your caregiver needs more information, he or she may order lab work or imaging tests (X-rays or MRIs). However, even if imaging tests show changes in your back, this usually does not require surgery. °HOME CARE INSTRUCTIONS °For many people, back pain returns. Since low back pain is rarely dangerous, it is often a condition that people can learn to manage on their own.  °· Remain active. It is stressful on the back to sit or stand in one place. Do not sit, drive, or stand in one place for more than 30 minutes at a time. Take short walks on level surfaces as soon as pain allows. Try to increase the length of time you walk each day. °· Do not stay in bed. Resting more than 1 or 2 days can delay your recovery. °· Do not avoid exercise or work. Your body is made to move. It is not dangerous to be active, even though your back may hurt. Your back will likely heal faster if you return to being active before your pain is gone. °· Pay attention to your body when you  bend and lift. Many people have less discomfort when lifting if they bend their knees, keep the load close to their bodies, and  avoid twisting. Often, the most comfortable positions are those that put less stress on your recovering back. °· Find a comfortable position to sleep. Use a firm mattress and lie on your side with your knees slightly bent. If you lie on your back, put a pillow under your knees. °· Only take over-the-counter or prescription medicines as directed by your caregiver. Over-the-counter medicines to reduce pain and inflammation are often the most helpful. Your caregiver may prescribe muscle relaxant drugs. These medicines help dull your pain so you can more quickly return to your normal activities and healthy exercise. °· Put ice on the injured area. °· Put ice in a plastic bag. °· Place a towel between your skin and the bag. °· Leave the ice on for 15-20 minutes, 03-04 times a day for the first 2 to 3 days. After that, ice and heat may be alternated to reduce pain and spasms. °· Ask your caregiver about trying back exercises and gentle massage. This may be of some benefit. °· Avoid feeling anxious or stressed. Stress increases muscle tension and can worsen back pain. It is important to recognize when you are anxious or stressed and learn ways to manage it. Exercise is a great option. °SEEK MEDICAL CARE IF: °· You have pain that is not relieved with rest or medicine. °· You have pain that does not improve in 1 week. °· You have new symptoms. °· You are generally not feeling well. °SEEK   IMMEDIATE MEDICAL CARE IF:  °· You have pain that radiates from your back into your legs. °· You develop new bowel or bladder control problems. °· You have unusual weakness or numbness in your arms or legs. °· You develop nausea or vomiting. °· You develop abdominal pain. °· You feel faint. °Document Released: 12/10/2005 Document Revised: 06/10/2012 Document Reviewed: 04/13/2014 °ExitCare® Patient Information ©2015 [employer]. This information is not intended to replace advice given to you by your health care provider. Make sure you  discuss any questions you have with your health care provider. ° °Muscle Cramps and Spasms °Muscle cramps and spasms occur when a muscle or muscles tighten and you have no control over this tightening (involuntary muscle contraction). They are a common problem and can develop in any muscle. The most common place is in the calf muscles of the leg. Both muscle cramps and muscle spasms are involuntary muscle contractions, but they also have differences:  °· Muscle cramps are sporadic and painful. They may last a few seconds to a quarter of an hour. Muscle cramps are often more forceful and last longer than muscle spasms. °· Muscle spasms may or may not be painful. They may also last just a few seconds or much longer. °CAUSES  °It is uncommon for cramps or spasms to be due to a serious underlying problem. In many cases, the cause of cramps or spasms is unknown. Some common causes are:  °· Overexertion.   °· Overuse from repetitive motions (doing the same thing over and over).   °· Remaining in a certain position for a long period of time.   °· Improper preparation, form, or technique while performing a sport or activity.   °· Dehydration.   °· Injury.   °· Side effects of some medicines.   °· Abnormally low levels of the salts and ions in your blood (electrolytes), especially potassium and calcium. This could happen if you are taking water pills (diuretics) or you are pregnant.   °Some underlying medical problems can make it more likely to develop cramps or spasms. These include, but are not limited to:  °· Diabetes.   °· Parkinson disease.   °· Hormone disorders, such as thyroid problems.   °· Alcohol abuse.   °· Diseases specific to muscles, joints, and bones.   °· Blood vessel disease where not enough blood is getting to the muscles.   °HOME CARE INSTRUCTIONS  °· Stay well hydrated. Drink enough water and fluids to keep your urine clear or pale yellow. °· It may be helpful to massage, stretch, and relax the affected  muscle. °· For tight or tense muscles, use a warm towel, heating pad, or hot shower water directed to the affected area. °· If you are sore or have pain after a cramp or spasm, applying ice to the affected area may relieve discomfort. °¨ Put ice in a plastic bag. °¨ Place a towel between your skin and the bag. °¨ Leave the ice on for 15-20 minutes, 03-04 times a day. °· Medicines used to treat a known cause of cramps or spasms may help reduce their frequency or severity. Only take over-the-counter or prescription medicines as directed by your caregiver. °SEEK MEDICAL CARE IF:  °Your cramps or spasms get more severe, more frequent, or do not improve over time.  °MAKE SURE YOU:  °· Understand these instructions. °· Will watch your condition. °· Will get help right away if you are not doing well or get worse. °Document Released: 06/01/2002 Document Revised: 04/06/2013 Document Reviewed: 11/26/2012 °ExitCare® Patient Information ©2015 [employer].   This information is not intended to replace advice given to you by your health care provider. Make sure you discuss any questions you have with your health care provider. ° °

## 2015-04-26 NOTE — Progress Notes (Signed)
  Medical screening examination/treatment/procedure(s) were performed by non-physician practitioner and as supervising physician I was immediately available for consultation/collaboration.     

## 2015-04-26 NOTE — Progress Notes (Addendum)
    MRN: [id number] DOB: 10/19/1976  Subjective:   Christina Nicholson is a 39 y.o. female presenting for chief complaint of Workers COmp; Knee Pain; and Back Pain  Reports fall today while at work today, works in assisted living at [employer]. Patient was walking in the facility, slipped over water and fell. Right leg slipped off laterally, followed by left leg slipping in front of her, patient then fell backward on her back. No head trauma, no loss of consciousness. Fall was witnessed by 2 people, patient was helped up. Currently, patient is limping, favoring her right knee due to pain. Also reports low back pain, left shoulder/scapular pain. All pain is achy and throbbing (especially knee), pain is constant, non-radiating. Has not tried any medications for relief. Denies incontinence, numbness and tingling, saddle paresthesias, decreased strength or sensation, swelling, erythema, bruising, hearing popping noises, bony deformities. Denies previous musculoskeletal surgeries including knee, back, shoulder. Denies any other aggravating or relieving factors, no other questions or concerns.  Christina Nicholson has No Known Allergies.  ROS As in subjective.  Objective:   Vitals: BP 158/72 mmHg  Pulse 67  Temp(Src) 97.9 F (36.6 C) (Oral)  Resp 16  Ht 5\' 9"  (1.753 m)  Wt 289 lb (131.09 kg)  BMI 42.66 kg/m2  SpO2 96%  Physical Exam  Constitutional: She is oriented to person, place, and time. She appears well-developed and well-nourished.  Cardiovascular: Normal rate.   Pulmonary/Chest: Effort normal.  Musculoskeletal:       Left shoulder: She exhibits tenderness (over scapular spine, AC joint) and spasm (over trapezius L>R). She exhibits normal range of motion, no swelling, no effusion, no crepitus, no deformity, no laceration and normal strength.       Right knee: She exhibits normal range of motion, no swelling, no effusion, no ecchymosis, no deformity, no laceration, no erythema,  normal alignment, no LCL laxity and normal patellar mobility. Tenderness (over medial, lateral aspect of knee and patella) found.       Lumbar back: She exhibits decreased range of motion (slightly decreased flexion and extension) and tenderness (over lumbar paraspinal muscles). She exhibits no bony tenderness, no swelling, no edema, no deformity, no laceration and no spasm.  Neurological: She is alert and oriented to person, place, and time.  Skin: Skin is warm and dry. No rash noted. No erythema. No pallor.  No ecchymosis.   [facility] reading (PRIMARY) by  Dr. Dareen PianoAnderson and PA-Katrinna Travieso. Left shoulder and scapula: normal x-ray. Lumbar: normal lumbar x-ray. Right knee: mild degenerative changes, no acute process noted.  Results for orders placed or performed in visit on 04/26/15 (from the past 24 hour(s))  POCT urine pregnancy     Status: None   Collection Time: 04/26/15 12:07 PM  Result Value Ref Range   Preg Test, Ur Negative    Assessment and Plan :   1. Fall from slipping on wet surface, initial encounter 2. Lumbar strain, initial encounter 3. Midline low back pain without sciatica 4. Muscle spasm of back 5. Trapezius muscle spasm 6. Pain in joint, shoulder region, left 7. Pain of left scapula 8. Right knee pain - will manage conservatively with meloxicam and Flexeril, work restrictions provided, return to clinic in 1 week for re-evaluation. Consider referral to physical therapy or orthopedist if no improvement in symptoms  Wallis BambergMario Ferne [place], PA-C [facility] [phone] 04/26/2015 11:04 AM

## 2015-04-27 ENCOUNTER — Ambulatory Visit: Payer: Worker's Compensation

## 2015-04-27 ENCOUNTER — Ambulatory Visit (INDEPENDENT_AMBULATORY_CARE_PROVIDER_SITE_OTHER): Payer: Worker's Compensation | Admitting: Internal Medicine

## 2015-04-27 ENCOUNTER — Telehealth: Payer: Self-pay | Admitting: Urgent Care

## 2015-04-27 VITALS — BP 130/90 | HR 98 | Temp 98.3°F | Resp 16

## 2015-04-27 DIAGNOSIS — M62830 Muscle spasm of back: Secondary | ICD-10-CM

## 2015-04-27 DIAGNOSIS — M545 Low back pain, unspecified: Secondary | ICD-10-CM

## 2015-04-27 DIAGNOSIS — M25512 Pain in left shoulder: Secondary | ICD-10-CM

## 2015-04-27 DIAGNOSIS — S4292XA Fracture of left shoulder girdle, part unspecified, initial encounter for closed fracture: Secondary | ICD-10-CM

## 2015-04-27 DIAGNOSIS — M25561 Pain in right knee: Secondary | ICD-10-CM

## 2015-04-27 DIAGNOSIS — W010XXD Fall on same level from slipping, tripping and stumbling without subsequent striking against object, subsequent encounter: Secondary | ICD-10-CM

## 2015-04-27 DIAGNOSIS — S39012D Strain of muscle, fascia and tendon of lower back, subsequent encounter: Secondary | ICD-10-CM

## 2015-04-27 DIAGNOSIS — M6248 Contracture of muscle, other site: Secondary | ICD-10-CM | POA: Diagnosis not present

## 2015-04-27 DIAGNOSIS — M62838 Other muscle spasm: Secondary | ICD-10-CM

## 2015-04-27 NOTE — Progress Notes (Signed)
MRN: [id number] DOB: [birth year]  Subjective:   Christina Nicholson is a 39 y.o. female presenting for follow up on possible left shoulder fracture.  Patient was seen 04/26/2015, for fall sustained while at work. Patient's chief complaint was right knee pain, low back pain and left shoulder pain. Radiographs were negative for acute process for all in clinic. However, radiologist over-read reported subtle, non-displaced left shoulder fracture. Patient was called today and asked about her current symptoms. Patient admitted that she had ongoing knee and back pain. She has been taking meloxicam and flexeril with some help but admits her pain is not worse than it was yesterday. I requested she return to clinic for re-evaluation of her left shoulder, discussed radiologist over-read and advised her that we would need a right shoulder x-ray for comparison. Patient then reported she did have left shoulder pain and agreed to come in. States that the pain is achy in nature, non-radiating, has slight decreased ROM but no decrease in strength or sensation. Denies any other aggravating or relieving factors, no other questions or concerns.  Christina Nicholson is currently taking meloxicam and Flexeril. She has No Known Allergies.  ROS As in subjective.  Objective:   Vitals: BP 130/90 mmHg  Pulse 98  Temp(Src) 98.3 F (36.8 C) (Oral)  Resp 16  SpO2 98%  LMP 04/06/2015 (Approximate)  Physical Exam  Constitutional: She is oriented to person, place, and time. She appears well-developed and well-nourished.  Cardiovascular: Normal rate.   Pulmonary/Chest: Effort normal.  Musculoskeletal:       Right shoulder: She exhibits normal range of motion, no tenderness, no bony tenderness, no swelling, no effusion, no crepitus, no deformity, no laceration, no spasm and normal strength.       Left shoulder: She exhibits bony tenderness (over AC joint) and spasm (left trapezius). She exhibits normal range of motion, no  tenderness, no swelling, no effusion, no crepitus, no deformity, no laceration and normal strength.  +Hawkins test, -Neers test  Neurological: She is alert and oriented to person, place, and time. She has normal reflexes.  Skin: Skin is warm and dry. No rash noted. No erythema. No pallor.   [facility] reading (PRIMARY) by  Dr. Perrin MalteseGuest and PA-Adelaine Roppolo. Right shoulder: no evidence of fracture or dislocation, please compare to left shoulder as today's x-ray was done for comparison to left shoulder which was read by radiologist as subtle left shoulder fracture 04/26/2015.  Dg Lumbar Spine Complete  04/26/2015   CLINICAL DATA:  Fall today.  Back pain  EXAM: LUMBAR SPINE - COMPLETE 4+ VIEW  COMPARISON:  None.  FINDINGS: Normal lumbar alignment. Negative for fracture or mass. Disc spaces are maintained. No pars defect.  IMPRESSION: Negative   Electronically Signed   By: Christina Palauharles  Nicholson M.D.   On: 04/26/2015 19:53   Dg Scapula Left  04/26/2015   CLINICAL DATA:  Left shoulder pain after slipping and falling at work earlier today. Pain is posterior.  EXAM: LEFT SCAPULA - 2+ VIEWS  COMPARISON:  Concurrently obtained radiographs of the left shoulder  FINDINGS: Subtle lucency overlies the superior aspect of the glenoid. Of note, it is less conspicuous on this view compared to the concurrently obtained radiographs of the shoulder. Visualized thorax is unremarkable. Normal bony mineralization.  IMPRESSION: Possible nondisplaced fracture through the superior aspect of the glenoid which was more conspicuous on the concurrently obtained radiographs of the left shoulder.   Electronically Signed   By: Christina MoanHeath  Nicholson M.D.   On: 04/26/2015 19:54  Dg Shoulder Right  04/27/2015   CLINICAL DATA:  LEFT shoulder pain after fall at work, comparison exam  EXAM: RIGHT SHOULDER - 2+ VIEW  COMPARISON:  None  FINDINGS: Osseous mineralization normal.  AC joint alignment normal.  No acute fracture, dislocation or bone destruction.  Visualized ribs  unremarkable.  IMPRESSION: Normal exam.   Electronically Signed   By: Ulyses SouthwardMark  Boles M.D.   On: 04/27/2015 10:42   Dg Shoulder Left  04/26/2015   CLINICAL DATA:  39 year old female with left shoulder pain after slipping and falling today at work  EXAM: LEFT SHOULDER - 2+ VIEW  COMPARISON:  None.  FINDINGS: Lucency through the superior aspect of the glenoid concerning for possible subtle nondisplaced fracture. The humerus is intact and located. No evidence of acromioclavicular joint cell abrasion. The visualized thorax is within normal limits.  IMPRESSION: Suspect nondisplaced fracture through the superior aspect of the glenoid.   Electronically Signed   By: Christina MoanHeath  Nicholson M.D.   On: 04/26/2015 19:53   Dg Knee Complete 4 Views Right  04/26/2015   CLINICAL DATA:  Right knee pain  EXAM: RIGHT KNEE - COMPLETE 4+ VIEW  COMPARISON:  None.  FINDINGS: There is no evidence of fracture, dislocation, or joint effusion. 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 Soft tissues are unremarkable.  IMPRESSION: No acute osseous injury of the right knee. 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   Electronically Signed   By: Elige KoHetal  Patel   On: 04/26/2015 19:54   Results for orders placed or performed in visit on 04/26/15 (from the past 24 hour(s))  POCT urine pregnancy     Status: None   Collection Time: 04/26/15 12:07 PM  Result Value Ref Range   Preg Test, Ur Negative    Assessment and Plan :   1. Shoulder fracture, left, closed, initial encounter 2. Left shoulder pain 3. Pain in joint, shoulder region, left - Physical exam not consistent with left shoulder fracture. However, given radiograph findings, must rule out fracture urgently, will refer to orthopedics for further evaluation and management. - Recommended arm sling, no use of left arm until definitive diagnosis can be made. New work restrictions  provided. Return to clinic on 05/03/2015 if evaluation by ortho has not been completed.  4. Fall from slipping on wet surface, subsequent encounter 5. Lumbar strain, subsequent encounter 6. Midline low back pain without sciatica 7. Right knee pain 8. Muscle spasm of back 9. Trapezius muscle spasm - Stable, continue work restrictions, continue using meloxicam and flexeril - Return to clinic on 05/03/2015 if patient has not been evaluated by orthopedics  Wallis BambergMario Jaishaun Mcnab, PA-C [facility] [phone] 04/27/2015 9:16 AM

## 2015-04-27 NOTE — Patient Instructions (Signed)
Shoulder Fracture °You have a fractured humerus (bone in the upper arm) at the shoulder just below the ball of the shoulder joint. Most of the time the bones of a broken shoulder are in an acceptable position. Usually the injury can be treated with a shoulder immobilizer or sling and swath bandage. These devices support the arm and prevent any shoulder movement. If the bones are not in a good position, then surgery is sometimes needed. Shoulder fractures usually cause swelling, pain, and discoloration around the upper arm initially. They heal in 8-12 weeks with proper treatment. °Rest in bed or a reclining chair as long as your shoulder is very painful. Sitting up generally results in less pain at the fracture site. Do not remove your shoulder bandage until your caregiver approves. You may apply ice packs over the shoulder for 20-30 minutes every 2 hours for the next 2-3 days to reduce the pain and swelling. Use your pain medicine as prescribed.  °SEEK IMMEDIATE MEDICAL CARE IF: °· You develop severe shoulder pain unrelieved by rest and taking pain medicine. °· You have pain, numbness, tingling, or weakness in the hand or wrist. °· You develop shortness of breath, chest pain, severe weakness, or fainting. °· You have severe pain with motion of the fingers or wrist. °MAKE SURE YOU:  °· Understand these instructions. °· Will watch your condition. °· Will get help right away if you are not doing well or get worse. °Document Released: 01/17/2005 Document Revised: 03/03/2012 Document Reviewed: 03/30/2009 °[employer]® Patient Information ©2015 [employer]. This information is not intended to replace advice given to you by your health care provider. Make sure you discuss any questions you have with your health care provider. ° °

## 2015-04-27 NOTE — Telephone Encounter (Signed)
Spoke with patient. Asked about her progress and patient admitted that she had ongoing knee pain and low back pain. I discussed x-ray over-read and need for her to come in for x-ray of right shoulder for comparison. Patient then stated that she also felt left shoulder pain and would be willing to come in today.

## 2015-05-02 ENCOUNTER — Telehealth: Payer: Self-pay | Admitting: Radiology

## 2015-05-02 ENCOUNTER — Ambulatory Visit (INDEPENDENT_AMBULATORY_CARE_PROVIDER_SITE_OTHER): Payer: Worker's Compensation | Admitting: Internal Medicine

## 2015-05-02 VITALS — BP 136/86 | HR 87 | Temp 98.0°F | Resp 18 | Ht 68.0 in | Wt 291.0 lb

## 2015-05-02 DIAGNOSIS — S39012D Strain of muscle, fascia and tendon of lower back, subsequent encounter: Secondary | ICD-10-CM

## 2015-05-02 DIAGNOSIS — S42145D Nondisplaced fracture of glenoid cavity of scapula, left shoulder, subsequent encounter for fracture with routine healing: Secondary | ICD-10-CM

## 2015-05-02 MED ORDER — HYDROCODONE-ACETAMINOPHEN 5-325 MG PO TABS: 1.0000 | ORAL_TABLET | Freq: Four times a day (QID) | ORAL | Status: DC | PRN

## 2015-05-02 NOTE — Patient Instructions (Signed)
Shoulder Fracture °You have a fractured humerus (bone in the upper arm) at the shoulder just below the ball of the shoulder joint. Most of the time the bones of a broken shoulder are in an acceptable position. Usually the injury can be treated with a shoulder immobilizer or sling and swath bandage. These devices support the arm and prevent any shoulder movement. If the bones are not in a good position, then surgery is sometimes needed. Shoulder fractures usually cause swelling, pain, and discoloration around the upper arm initially. They heal in 8-12 weeks with proper treatment. °Rest in bed or a reclining chair as long as your shoulder is very painful. Sitting up generally results in less pain at the fracture site. Do not remove your shoulder bandage until your caregiver approves. You may apply ice packs over the shoulder for 20-30 minutes every 2 hours for the next 2-3 days to reduce the pain and swelling. Use your pain medicine as prescribed.  °SEEK IMMEDIATE MEDICAL CARE IF: °· You develop severe shoulder pain unrelieved by rest and taking pain medicine. °· You have pain, numbness, tingling, or weakness in the hand or wrist. °· You develop shortness of breath, chest pain, severe weakness, or fainting. °· You have severe pain with motion of the fingers or wrist. °MAKE SURE YOU:  °· Understand these instructions. °· Will watch your condition. °· Will get help right away if you are not doing well or get worse. °Document Released: 01/17/2005 Document Revised: 03/03/2012 Document Reviewed: 03/30/2009 °[employer]® Patient Information ©2015 [employer]. This information is not intended to replace advice given to you by your health care provider. Make sure you discuss any questions you have with your health care provider. ° °

## 2015-05-02 NOTE — Telephone Encounter (Signed)
Spoke with patient, [facility] for her [facility]t.

## 2015-05-02 NOTE — Progress Notes (Signed)
   Subjective:    Patient ID: Christina NoseMichelle Burek, female    DOB: 1976-11-17, 39 y.o.   MRN: [id number]  HPI Fx glenoid needs orthopedist., [facility]. Will do urgent referral today . Has LBstrain, no radiculopathy, no incontinence Has ankle swelling new, should stop all NSAIDs Review of Systems     Objective:   Physical Exam  Constitutional: She is oriented to person, place, and time. She appears well-nourished. She appears distressed.  HENT:  Head: Normocephalic.  Eyes: EOM are normal.  Neck: Normal range of motion.  Pulmonary/Chest: Effort normal.  Musculoskeletal:       Left shoulder: She exhibits decreased range of motion, tenderness, bony tenderness, swelling, pain, spasm and decreased strength.       Lumbar back: She exhibits decreased range of motion, tenderness, pain and spasm. She exhibits no bony tenderness, no deformity and normal pulse.  Neurological: She is alert and oriented to person, place, and time. She has normal strength. No sensory deficit. Coordination and gait normal.  Psychiatric: She has a normal mood and affect.          Assessment & Plan:  Fx Glenoid, back strain, other contusions and strains Refer to orthopedist today/Take xrays/Calls made today

## 2015-05-02 NOTE — Telephone Encounter (Signed)
Attempted twice to call pt and inform her that her appt is today at 1:45, per Harland DingwallSuzy Dixon. No answer and unable to leave voicemail due to voicemail box being full.

## 2015-05-19 ENCOUNTER — Other Ambulatory Visit: Payer: Self-pay | Admitting: Family Medicine

## 2015-05-19 DIAGNOSIS — M25512 Pain in left shoulder: Secondary | ICD-10-CM

## 2015-05-24 ENCOUNTER — Other Ambulatory Visit: Payer: Self-pay

## 2015-09-12 ENCOUNTER — Other Ambulatory Visit: Payer: Self-pay | Admitting: Family Medicine

## 2015-09-12 DIAGNOSIS — M549 Dorsalgia, unspecified: Secondary | ICD-10-CM

## 2015-09-22 ENCOUNTER — Other Ambulatory Visit: Payer: Self-pay | Admitting: Family Medicine

## 2015-09-22 DIAGNOSIS — M25512 Pain in left shoulder: Secondary | ICD-10-CM

## 2015-10-02 ENCOUNTER — Ambulatory Visit
Admission: RE | Admit: 2015-10-02 | Discharge: 2015-10-02 | Disposition: A | Discharge status: Home / Self Care | Payer: No Typology Code available for payment source | Source: Ambulatory Visit | Attending: Family Medicine | Admitting: Family Medicine

## 2015-10-02 DIAGNOSIS — M25512 Pain in left shoulder: Secondary | ICD-10-CM

## 2015-10-25 ENCOUNTER — Encounter (HOSPITAL_COMMUNITY): Payer: Self-pay | Admitting: Emergency Medicine

## 2015-10-25 ENCOUNTER — Emergency Department (HOSPITAL_COMMUNITY)
Admission: EM | Admit: 2015-10-25 | Discharge: 2015-10-25 | Disposition: A | Discharge status: Home / Self Care | Payer: 59 | Source: Home / Self Care | Attending: Emergency Medicine | Admitting: Emergency Medicine

## 2015-10-25 ENCOUNTER — Encounter (HOSPITAL_COMMUNITY): Payer: Self-pay | Admitting: *Deleted

## 2015-10-25 ENCOUNTER — Emergency Department (HOSPITAL_COMMUNITY): Payer: 59

## 2015-10-25 ENCOUNTER — Inpatient Hospital Stay (HOSPITAL_COMMUNITY)
Admission: EM | Admit: 2015-10-25 | Discharge: 2015-10-29 | DRG: 378 | Disposition: A | Discharge status: Home / Self Care | Payer: 59 | Attending: Internal Medicine | Admitting: Internal Medicine

## 2015-10-25 DIAGNOSIS — K625 Hemorrhage of anus and rectum: Secondary | ICD-10-CM

## 2015-10-25 DIAGNOSIS — K5731 Diverticulosis of large intestine without perforation or abscess with bleeding: Principal | ICD-10-CM | POA: Diagnosis present

## 2015-10-25 DIAGNOSIS — I10 Essential (primary) hypertension: Secondary | ICD-10-CM | POA: Diagnosis present

## 2015-10-25 DIAGNOSIS — R000 Tachycardia, unspecified: Secondary | ICD-10-CM | POA: Diagnosis present

## 2015-10-25 DIAGNOSIS — Z6841 Body Mass Index (BMI) 40.0 and over, adult: Secondary | ICD-10-CM

## 2015-10-25 DIAGNOSIS — K922 Gastrointestinal hemorrhage, unspecified: Secondary | ICD-10-CM | POA: Diagnosis present

## 2015-10-25 DIAGNOSIS — D72829 Elevated white blood cell count, unspecified: Secondary | ICD-10-CM | POA: Diagnosis present

## 2015-10-25 DIAGNOSIS — Z833 Family history of diabetes mellitus: Secondary | ICD-10-CM

## 2015-10-25 DIAGNOSIS — Z79899 Other long term (current) drug therapy: Secondary | ICD-10-CM

## 2015-10-25 HISTORY — DX: Essential (primary) hypertension: I10

## 2015-10-25 LAB — CBC
HCT: 43.6 % (ref 36.0–46.0)
Hemoglobin: 14.1 g/dL (ref 12.0–15.0)
MCH: 26.1 pg (ref 26.0–34.0)
MCHC: 32.3 g/dL (ref 30.0–36.0)
MCV: 80.7 fL (ref 78.0–100.0)
Platelets: 211 10*3/uL (ref 150–400)
RBC: 5.4 MIL/uL — ABNORMAL HIGH (ref 3.87–5.11)
RDW: 15.2 % (ref 11.5–15.5)
WBC: 8.2 10*3/uL (ref 4.0–10.5)

## 2015-10-25 LAB — COMPREHENSIVE METABOLIC PANEL
ALT: 18 U/L (ref 14–54)
AST: 20 U/L (ref 15–41)
Albumin: 3.7 g/dL (ref 3.5–5.0)
Alkaline Phosphatase: 84 U/L (ref 38–126)
Anion gap: 10 (ref 5–15)
BUN: 16 mg/dL (ref 6–20)
CO2: 22 mmol/L (ref 22–32)
Calcium: 9.4 mg/dL (ref 8.9–10.3)
Chloride: 110 mmol/L (ref 101–111)
Creatinine, Ser: 0.67 mg/dL (ref 0.44–1.00)
GFR calc Af Amer: >60 mL/min (ref >60)
GFR calc non Af Amer: >60 mL/min (ref >60)
Glucose, Bld: 101 mg/dL — ABNORMAL HIGH (ref 65–99)
Potassium: 3.4 mmol/L — ABNORMAL LOW (ref 3.5–5.1)
Sodium: 142 mmol/L (ref 135–145)
Total Bilirubin: 0.4 mg/dL (ref 0.3–1.2)
Total Protein: 7.7 g/dL (ref 6.5–8.1)

## 2015-10-25 LAB — CBC WITH DIFFERENTIAL/PLATELET
BASOS PCT: 0 %
Basophils Absolute: 0 10*3/uL (ref 0.0–0.1)
EOS ABS: 0 10*3/uL (ref 0.0–0.7)
EOS PCT: 0 %
HCT: 38.9 % (ref 36.0–46.0)
Hemoglobin: 12.7 g/dL (ref 12.0–15.0)
LYMPHS ABS: 1.5 10*3/uL (ref 0.7–4.0)
Lymphocytes Relative: 15 %
MCH: 26.3 pg (ref 26.0–34.0)
MCHC: 32.6 g/dL (ref 30.0–36.0)
MCV: 80.7 fL (ref 78.0–100.0)
MONOS PCT: 0 %
Monocytes Absolute: 0 10*3/uL — ABNORMAL LOW (ref 0.1–1.0)
NEUTROS PCT: 85 %
Neutro Abs: 8.5 10*3/uL — ABNORMAL HIGH (ref 1.7–7.7)
PLATELETS: 278 10*3/uL (ref 150–400)
RBC: 4.82 MIL/uL (ref 3.87–5.11)
RDW: 15 % (ref 11.5–15.5)
WBC: 10 10*3/uL (ref 4.0–10.5)

## 2015-10-25 LAB — TYPE AND SCREEN
ABO/RH(D): A POS
Antibody Screen: NEGATIVE

## 2015-10-25 LAB — ABO/RH: ABO/RH(D): A POS

## 2015-10-25 LAB — POC OCCULT BLOOD, ED: Fecal Occult Bld: POSITIVE — AB

## 2015-10-25 MED ORDER — SODIUM CHLORIDE 0.9 % IV BOLUS (SEPSIS)
1000.0000 mL | Freq: Once | INTRAVENOUS | Status: AC
  Administered 2015-10-25: 1000 mL via INTRAVENOUS

## 2015-10-25 NOTE — ED Provider Notes (Signed)
CSN: [id number]     Arrival date & time 10/25/15  1514 History   First MD Initiated Contact with Patient 10/25/15 1850     Chief Complaint  Patient presents with  . Rectal Bleeding     (Consider location/radiation/quality/duration/timing/severity/associated sxs/prior Treatment) HPI Christina Nicholson is a 39 y.o. female who comes in for evaluation of bloody stool. Patient reports earlier today at approximately 2:30 PM this afternoon she experienced some abdominal cramping, attempted to have a bowel movement and noted "dark red daily stool go. She reports several more bowel movements throughout the afternoon and evening that appeared to be bright red blood in nature. She denies any history of hemorrhoids. She reports going on a cruise 2 weeks ago. She denies fevers, chills, other abdominal pain, urinary symptoms, chest pain or shortness of breath, dizziness, rash. No discomfort now in the ED. Nothing seemed to make the problem better or worse. No interventions tried. No anticoagulation.  Past Medical History  Diagnosis Date  . Trichomonas infection   . Hypertension    Past Surgical History  Procedure Laterality Date  . Dilation and curettage of uterus  2004   Family History  Problem Relation Age of Onset  . Diabetes Mother   . Diabetes Maternal Aunt   . Diabetes Maternal Uncle   . Diabetes Maternal Grandmother   . Diabetes Maternal Grandfather    Social History  Substance Use Topics  . Smoking status: Never Smoker   . Smokeless tobacco: None  . Alcohol Use: No   OB History    Gravida Para Term Preterm AB TAB SAB Ectopic Multiple Living   0         0     Review of Systems A 10 point review of systems was completed and was negative except for pertinent positives and negatives as mentioned in the history of present illness     Allergies  Review of patient's allergies indicates no known allergies.  Home Medications   Prior to Admission medications   Medication Sig Start  Date End Date Taking? Authorizing Provider  acetaminophen-codeine (TYLENOL #3) 300-30 MG tablet Take 1-2 tablets by mouth 2 (two) times daily as needed for moderate pain.   Yes Historical Provider, MD  lisinopril (PRINIVIL,ZESTRIL) 10 MG tablet Take 10 mg by mouth daily.   Yes Historical Provider, MD  naproxen (NAPROSYN) 500 MG tablet Take 500 mg by mouth daily.   Yes Historical Provider, MD  topiramate (TOPAMAX) 25 MG capsule Take 25 mg by mouth at bedtime.   Yes Historical Provider, MD  cyclobenzaprine (FLEXERIL) 10 MG tablet Take 1 tablet (10 mg total) by mouth 3 (three) times daily as needed for muscle spasms. Patient not taking: Reported on 10/25/2015 04/26/15   Wallis BambergMario Mani, PA-C  HYDROcodone-acetaminophen (NORCO/VICODIN) 5-325 MG per tablet Take 1 tablet by mouth every 6 (six) hours as needed. Patient not taking: Reported on 10/25/2015 05/02/15   Jonita Albeehris W Guest, MD  meloxicam (MOBIC) 15 MG tablet Take 1 tablet (15 mg total) by mouth daily. Patient not taking: Reported on 10/25/2015 04/26/15   Wallis BambergMario Mani, PA-C  metFORMIN (GLUCOPHAGE) 500 MG tablet Take 500 mg by mouth 3 (three) times daily with meals.    Historical Provider, MD   BP 101/71 mmHg  Pulse 88  Temp(Src) 98.9 F (37.2 C) (Oral)  Resp 17  Ht 5\' 6"  (1.676 m)  Wt 260 lb (117.935 kg)  BMI 41.99 kg/m2  SpO2 99% Physical Exam  Constitutional: She is oriented to person,  place, and time. She appears well-developed and well-nourished.  Obese African-American female.  HENT:  Head: Normocephalic and atraumatic.  Mouth/Throat: Oropharynx is clear and moist.  Eyes: Conjunctivae are normal. Pupils are equal, round, and reactive to light. Right eye exhibits no discharge. Left eye exhibits no discharge. No scleral icterus.  Neck: Neck supple.  Cardiovascular: Normal rate, regular rhythm and normal heart sounds.   Pulmonary/Chest: Effort normal and breath sounds normal. No respiratory distress. She has no wheezes. She has no rales.  Abdominal:  Soft. There is no tenderness.  Large panniculus. No overt or focal abdominal tenderness  Genitourinary:  Female chaperone present for entirety of rectal exam. On exam, there is mild amount of frank, dark blood. No obvious hemorrhoids, fissures or other anatomical abnormalities.  Musculoskeletal: She exhibits no tenderness.  Neurological: She is alert and oriented to person, place, and time.  Cranial Nerves II-XII grossly intact  Skin: Skin is warm and dry. No rash noted.  Psychiatric: She has a normal mood and affect.  Nursing note and vitals reviewed.   ED Course  Procedures (including critical care time) Labs Review Labs Reviewed  COMPREHENSIVE METABOLIC PANEL - Abnormal; Notable for the following:    Potassium 3.4 (*)    Glucose, Bld 101 (*)    All other components within normal limits  CBC - Abnormal; Notable for the following:    RBC 5.40 (*)    All other components within normal limits  POC OCCULT BLOOD, ED  TYPE AND SCREEN  ABO/RH    Imaging Review No results found. I have personally reviewed and evaluated these images and lab results as part of my medical decision-making.   EKG Interpretation None     Filed Vitals:   10/25/15 1915 10/25/15 1945 10/25/15 2000 10/25/15 2026  BP: 101/71 124/78 118/74 129/88  Pulse: 88 92 100 93  Temp:      TempSrc:      Resp: 17 28    Height:      Weight:      SpO2: 99% 100% 99% 100%    MDM  Vitals stable - WNL -afebrile Pt resting comfortably in ED. PE--benign abdominal exam. Homero Fellers burgundy colored blood on rectal exam with no obvious hemorrhoids, fissures or other anatomical abnormalities. Labwork-hemoglobin 14.1. Labs otherwise noncontributory.  DDX--patient with bloody stool since 2:30 PM today. Remains asymptomatic without any lightheadedness, dizziness, shortness of breath, chest pain. Hemodynamically stable with normal vital signs. Low suspicion for any acute or emergent pathology at this time. Will give patient a  referral for outpatient GI follow-up area discussed we will need follow-up in the next 2 days for reevaluation. Return to ED immediately for any of the aforementioned symptoms or any subjective worsening.  I discussed all relevant lab findings and imaging results with pt and they verbalized understanding. Discussed f/u with PCP within 48 hrs and return precautions, pt very amenable to plan. Prior to patient discharge, I discussed and reviewed this case with Dr. Juleen China who agrees with above plan  Final diagnoses:  Rectal bleeding        Joycie Peek, PA-C 10/26/15 0128  Raeford Razor, MD 10/30/15 2127

## 2015-10-25 NOTE — ED Notes (Signed)
Pt ambulatory to bathroom

## 2015-10-25 NOTE — ED Provider Notes (Signed)
CSN: [id number]     Arrival date & time 10/25/15  2227 History  By signing my name below, I, Tanda Rockers, attest that this documentation has been prepared under the direction and in the presence of Melene Plan, DO. Electronically Signed: Tanda Rockers, ED Scribe. 10/25/2015. 11:25 PM.   Cc: rectal bleeding The history is provided by the patient. No language interpreter was used.     HPI Comments: Christina Nicholson is a 39 y.o. female who presents to the Emergency Department complaining of rectal bleeding that began at 4 PM today (approximately 7 hours ago). Pt has had approximately 10 episodes of rectal bleeding today. Pt was seen at [facility] ED earlier today for same symptoms. She states that since being discharged from [facility] she has had 2 episodes of blood clots, prompting her to return. The last time she had an episode was just PTA. Pt states she feels hot and sluggish. She also complains of abdominal cramping that occurs just before she has a bowel movement. Pt had a cortisone shot earlier today prior to the rectal bleeding, that she states was painful. Denies lightheadedness, dizziness, or any other associated symptoms. No hx hemorrhoids. No chance of pregnancy.   Past Medical History  Diagnosis Date  . Trichomonas infection   . Hypertension    Past Surgical History  Procedure Laterality Date  . Dilation and curettage of uterus  2004   Family History  Problem Relation Age of Onset  . Diabetes Mother   . Diabetes Maternal Aunt   . Diabetes Maternal Uncle   . Diabetes Maternal Grandmother   . Diabetes Maternal Grandfather    Social History  Substance Use Topics  . Smoking status: Never Smoker   . Smokeless tobacco: None  . Alcohol Use: No   OB History    Gravida Para Term Preterm AB TAB SAB Ectopic Multiple Living   0         0     Review of Systems  Constitutional: Negative for fever and chills.  HENT: Negative for congestion and rhinorrhea.   Eyes: Negative for redness and  visual disturbance.  Respiratory: Negative for shortness of breath and wheezing.   Cardiovascular: Negative for chest pain and palpitations.  Gastrointestinal: Positive for abdominal pain and blood in stool. Negative for nausea and vomiting.  Genitourinary: Negative for dysuria and urgency.  Musculoskeletal: Negative for myalgias and arthralgias.  Skin: Negative for pallor and wound.  Neurological: Negative for dizziness, light-headedness and headaches.   Allergies  Review of patient's allergies indicates no known allergies.  Home Medications   Prior to Admission medications   Medication Sig Start Date End Date Taking? Authorizing Provider  lisinopril (PRINIVIL,ZESTRIL) 10 MG tablet Take 10 mg by mouth daily.   Yes Historical Provider, MD  topiramate (TOPAMAX) 25 MG capsule Take 50 mg by mouth at bedtime.    Yes Historical Provider, MD  acetaminophen-codeine (TYLENOL #3) 300-30 MG tablet Take 1-2 tablets by mouth 2 (two) times daily as needed for moderate pain.    Historical Provider, MD  naproxen (NAPROSYN) 500 MG tablet Take 500 mg by mouth daily.    Historical Provider, MD   Triage Vitals: BP 152/93 mmHg  Pulse 121  Temp(Src) 98.3 F (36.8 C) (Oral)  Resp 22  SpO2 98%  LMP 09/18/2015   Physical Exam  Constitutional: She is oriented to person, place, and time. She appears well-developed and well-nourished. No distress.  HENT:  Head: Normocephalic and atraumatic.  Eyes: EOM are normal.  Pupils are equal, round, and reactive to light.  Neck: Normal range of motion. Neck supple.  Cardiovascular: Normal rate and regular rhythm.  Exam reveals no gallop and no friction rub.   No murmur heard. Pulmonary/Chest: Effort normal. She has no wheezes. She has no rales.  Abdominal: Soft. She exhibits no distension. There is no tenderness.  No noted focal abdominal tenderness  Musculoskeletal: She exhibits no edema or tenderness.  Neurological: She is alert and oriented to person, place,  and time.  Skin: Skin is warm and dry. She is not diaphoretic. There is pallor.  Mild pallor  Psychiatric: She has a normal mood and affect. Her behavior is normal.  Nursing note and vitals reviewed.   ED Course  Procedures (including critical care time)  DIAGNOSTIC STUDIES: Oxygen Saturation is 98% on RA, normal by my interpretation.    COORDINATION OF CARE: 11:22 PM-Discussed treatment plan which includes CT A/P and CBC with pt at bedside and pt agreed to plan.   Labs Review Labs Reviewed  CBC WITH DIFFERENTIAL/PLATELET - Abnormal; Notable for the following:    Neutro Abs 8.5 (*)    Monocytes Absolute 0.0 (*)    All other components within normal limits  CBC  I-STAT BETA HCG BLOOD, ED ([facility], [facility], [facility] ONLY)  TYPE AND SCREEN    Imaging Review Ct Abdomen Pelvis W Contrast  10/26/2015  CLINICAL DATA:  Rectal bleeding with abdominal cramping prior to bowel movements. EXAM: CT ABDOMEN AND PELVIS WITH CONTRAST TECHNIQUE: Multidetector CT imaging of the abdomen and pelvis was performed using the standard protocol following bolus administration of intravenous contrast. CONTRAST:  100mL OMNIPAQUE IOHEXOL 300 MG/ML  SOLN COMPARISON:  None. FINDINGS: Lung bases are clear. Mild focal fatty infiltration adjacent to the falciform ligament of the liver. No other focal liver lesions identified. Gallbladder, spleen, pancreas, adrenal glands, kidneys, abdominal aorta, inferior vena cava, and retroperitoneal lymph nodes are unremarkable. Stomach, small bowel, and colon are not abnormally distended. No free air or free fluid in the abdomen. Pelvis: Appendix is normal. Uterus and ovaries are not enlarged. No free or loculated pelvic fluid collections. No pelvic mass or lymphadenopathy. Bladder wall is not thickened. Diverticula in the sigmoid colon without evidence of diverticulitis. No destructive bone lesions. IMPRESSION: No acute process demonstrated in the abdomen or pelvis. Scattered diverticula in the  colon. No evidence of diverticulitis. Focal fatty infiltration in the liver. Electronically Signed   By: Burman NievesWilliam  Stevens M.D.   On: 10/26/2015 01:25   I have personally reviewed and evaluated these images and lab results as part of my medical decision-making.   EKG Interpretation None      MDM   Final diagnoses:  Rectal bleeding    Patient is a 39 y.o. female who presents with bright red blood per rectum.  This started this morning. Patient has had about 10 bowel movements that is been only blood. Patient has been feeling lightheaded and dizzy. Was [facility] had a normal CBC CMP, vital signs were stable and she was discharged home. Patient states since then she's had about 6 more bowel movements. Tachycardic into the 120s on arrival here. Some mild pallor on exam. 2 g hemoglobin drop since this morning. Likely lower GI bleed. Admit to the hospital.  The patients results and plan were reviewed and discussed.   Any x-rays performed were independently reviewed by myself.   Differential diagnosis were considered with the presenting HPI.  Medications  0.9 %  sodium chloride infusion ( Intravenous New Bag/Given 10/26/15 0251)  topiramate (TOPAMAX) tablet 50 mg (50 mg Oral Given 10/26/15 0251)  sodium chloride 0.9 % injection 3 mL (3 mLs Intravenous Not Given 10/26/15 0310)  sodium chloride 0.9 % bolus 1,000 mL (0 mLs Intravenous Stopped 10/26/15 0109)  iohexol (OMNIPAQUE) 300 MG/ML solution 100 mL (100 mLs Intravenous Contrast Given 10/26/15 0100)  sodium chloride 0.9 % bolus 1,000 mL (1,000 mLs Intravenous New Bag/Given 10/26/15 0211)    Filed Vitals:   10/25/15 2253 10/26/15 0225  BP: 152/93 149/75  Pulse: 121 85  Temp: 98.3 F (36.8 C) 98.1 F (36.7 C)  TempSrc: Oral Oral  Resp: 22 20  Height:   (1.676 m)  Weight:  292 lb (132.45 kg)  SpO2: 98% 100%    Final diagnoses:  Rectal bleeding    Admission/ observation were discussed with the admitting  physician, patient and/or family and they are comfortable with the plan.     I personally performed the services described in this documentation, which was scribed in my presence. The recorded information has been reviewed and is accurate.      Melene Plan, DO 10/26/15 [phone]

## 2015-10-25 NOTE — ED Notes (Signed)
Patient c/o rectal bleeding with clots since 1400 today. [facility]. Also c/o lower abdominal pain,r ates pain 8/10.

## 2015-10-25 NOTE — ED Notes (Signed)
Pt states that she had a cortisone shot 1 hr ago and then on the way home she was cramping and went home and used the restroom and the toiled was filled with dark red "gooey" blood. States she went again and it was bright red blood. Denies hx of same. Denies use of blood thinners.

## 2015-10-25 NOTE — Discharge Instructions (Signed)
Is important for you to follow-up with [place] [facility] within the next 1-2 days for reevaluation of your rectal bleeding. Return to ED sooner if you experience fevers, chills, worsening abdominal pain, dizziness, lightheadedness, shortness of breath, chest pain or feel again your symptoms are worsening.  Gastrointestinal Bleeding Gastrointestinal (GI) bleeding means there is bleeding somewhere along the digestive tract, between the mouth and anus. CAUSES  There are many different problems that can cause GI bleeding. Possible causes include:  Esophagitis. This is inflammation, irritation, or swelling of the esophagus.  Hemorrhoids.These are veins that are full of blood (engorged) in the rectum. They cause pain, inflammation, and may bleed.  Anal fissures.These are areas of painful tearing which may bleed. They are often caused by passing hard stool.  Diverticulosis.These are pouches that form on the colon over time, with age, and may bleed significantly.  Diverticulitis.This is inflammation in areas with diverticulosis. It can cause pain, fever, and bloody stools, although bleeding is rare.  Polyps and cancer. Colon cancer often starts out as precancerous polyps.  Gastritis and ulcers.Bleeding from the upper gastrointestinal tract (near the stomach) may travel through the intestines and produce black, sometimes tarry, often bad smelling stools. In certain cases, if the bleeding is fast enough, the stools may not be black, but red. This condition may be life-threatening. SYMPTOMS   Vomiting bright red blood or material that looks like coffee grounds.  Bloody, black, or tarry stools. DIAGNOSIS  Your caregiver may diagnose your condition by taking your history and performing a physical exam. More tests may be needed, including:  X-rays and other imaging tests.  Esophagogastroduodenoscopy (EGD). This test uses a flexible, lighted tube to look at your esophagus, stomach, and  small intestine.  Colonoscopy. This test uses a flexible, lighted tube to look at your colon. TREATMENT  Treatment depends on the cause of your bleeding.   For bleeding from the esophagus, stomach, small intestine, or colon, the caregiver doing your EGD or colonoscopy may be able to stop the bleeding as part of the procedure.  Inflammation or infection of the colon can be treated with medicines.  Many rectal problems can be treated with creams, suppositories, or warm baths.  Surgery is sometimes needed.  Blood transfusions are sometimes needed if you have lost a lot of blood. If bleeding is slow, you may be allowed to go home. If there is a lot of bleeding, you will need to stay in the hospital for observation. HOME CARE INSTRUCTIONS   Take any medicines exactly as prescribed.  Keep your stools soft by eating foods that are high in fiber. These foods include whole grains, legumes, fruits, and vegetables. Prunes (1 to 3 a day) work well for many people.  Drink enough fluids to keep your urine clear or pale yellow. SEEK IMMEDIATE MEDICAL CARE IF:   Your bleeding increases.  You feel lightheaded, weak, or you faint.  You have severe cramps in your back or abdomen.  You pass large blood clots in your stool.  Your problems are getting worse. MAKE SURE YOU:   Understand these instructions.  Will watch your condition.  Will get help right away if you are not doing well or get worse.   This information is not intended to replace advice given to you by your health care provider. Make sure you discuss any questions you have with your health care provider.   Document Released: 12/07/2000 Document Revised: 11/26/2012 Document Reviewed: 05/30/2015 Elsevier Interactive Patient Education [employer]2016 Elsevier Inc.

## 2015-10-26 ENCOUNTER — Encounter (HOSPITAL_COMMUNITY): Payer: Self-pay

## 2015-10-26 ENCOUNTER — Inpatient Hospital Stay (HOSPITAL_COMMUNITY): Payer: 59

## 2015-10-26 DIAGNOSIS — K625 Hemorrhage of anus and rectum: Secondary | ICD-10-CM | POA: Diagnosis present

## 2015-10-26 DIAGNOSIS — R000 Tachycardia, unspecified: Secondary | ICD-10-CM | POA: Diagnosis present

## 2015-10-26 DIAGNOSIS — K922 Gastrointestinal hemorrhage, unspecified: Secondary | ICD-10-CM | POA: Diagnosis not present

## 2015-10-26 DIAGNOSIS — D72829 Elevated white blood cell count, unspecified: Secondary | ICD-10-CM | POA: Diagnosis not present

## 2015-10-26 DIAGNOSIS — Z79899 Other long term (current) drug therapy: Secondary | ICD-10-CM | POA: Diagnosis not present

## 2015-10-26 DIAGNOSIS — Z833 Family history of diabetes mellitus: Secondary | ICD-10-CM | POA: Diagnosis not present

## 2015-10-26 DIAGNOSIS — K5731 Diverticulosis of large intestine without perforation or abscess with bleeding: Secondary | ICD-10-CM | POA: Diagnosis present

## 2015-10-26 DIAGNOSIS — Z6841 Body Mass Index (BMI) 40.0 and over, adult: Secondary | ICD-10-CM | POA: Diagnosis not present

## 2015-10-26 DIAGNOSIS — I10 Essential (primary) hypertension: Secondary | ICD-10-CM | POA: Diagnosis not present

## 2015-10-26 LAB — CBC
HCT: 36 % (ref 36.0–46.0)
HEMATOCRIT: 36.1 % (ref 36.0–46.0)
HEMOGLOBIN: 11.7 g/dL — AB (ref 12.0–15.0)
HEMOGLOBIN: 11.8 g/dL — AB (ref 12.0–15.0)
MCH: 26.3 pg (ref 26.0–34.0)
MCH: 26.6 pg (ref 26.0–34.0)
MCHC: 32.4 g/dL (ref 30.0–36.0)
MCHC: 32.8 g/dL (ref 30.0–36.0)
MCV: 81.1 fL (ref 78.0–100.0)
MCV: 81.1 fL (ref 78.0–100.0)
PLATELETS: 242 10*3/uL (ref 150–400)
Platelets: 212 10*3/uL (ref 150–400)
RBC: 4.45 MIL/uL (ref 3.87–5.11)
RBC: 4.44 MIL/uL (ref 3.87–5.11)
RDW: 15.3 % (ref 11.5–15.5)
RDW: 15.2 % (ref 11.5–15.5)
WBC: 10.5 10*3/uL (ref 4.0–10.5)
WBC: 15.2 10*3/uL — ABNORMAL HIGH (ref 4.0–10.5)

## 2015-10-26 LAB — I-STAT BETA HCG BLOOD, ED (MC, WL, AP ONLY): I-stat hCG, quantitative: <5 m[IU]/mL (ref <5)

## 2015-10-26 LAB — ABO/RH: ABO/RH(D): A POS

## 2015-10-26 LAB — TYPE AND SCREEN
ABO/RH(D): A POS
ANTIBODY SCREEN: NEGATIVE

## 2015-10-26 MED ORDER — SODIUM CHLORIDE 0.9 % IV BOLUS (SEPSIS)
1000.0000 mL | Freq: Once | INTRAVENOUS | Status: AC
  Administered 2015-10-26: 1000 mL via INTRAVENOUS

## 2015-10-26 MED ORDER — SODIUM CHLORIDE 0.9 % IV SOLN
INTRAVENOUS | Status: DC
  Administered 2015-10-26 – 2015-10-29 (×6): via INTRAVENOUS

## 2015-10-26 MED ORDER — SODIUM CHLORIDE 0.9 % IJ SOLN
3.0000 mL | Freq: Two times a day (BID) | INTRAMUSCULAR | Status: DC
  Administered 2015-10-26 – 2015-10-29 (×6): 3 mL via INTRAVENOUS

## 2015-10-26 MED ORDER — TOPIRAMATE 25 MG PO TABS
50.0000 mg | ORAL_TABLET | Freq: Every day | ORAL | Status: DC
  Administered 2015-10-26 – 2015-10-28 (×4): 50 mg via ORAL
  Filled 2015-10-26 (×5): qty 2

## 2015-10-26 MED ORDER — IOHEXOL 300 MG/ML  SOLN
100.0000 mL | Freq: Once | INTRAMUSCULAR | Status: AC | PRN
  Administered 2015-10-26: 100 mL via INTRAVENOUS

## 2015-10-26 MED ORDER — PEG 3350-KCL-NA BICARB-NACL 420 G PO SOLR
4000.0000 mL | Freq: Once | ORAL | Status: AC
  Administered 2015-10-26: 4000 mL via ORAL

## 2015-10-26 NOTE — Consult Note (Signed)
Subjective:   HPI  The patient is a 39 year old female who was admitted to the hospital he comes of rectal bleeding. She started to experience rectal bleeding at about 2 PM yesterday. She has continued to have episodes of rectal bleeding which she describes as reddish blood with clots. She denies vomiting of blood. She had some abdominal cramping associated with this rectal bleeding.  Review of Systems No chest pain or shortness of breath  Past Medical History  Diagnosis Date  . Trichomonas infection   . Hypertension    Past Surgical History  Procedure Laterality Date  . Dilation and curettage of uterus  2004   Social History   Social History  . Marital Status: Legally Separated    Spouse Name: N/A  . Number of Children: N/A  . Years of Education: N/A   Occupational History  . Not on file.   Social History Main Topics  . Smoking status: Never Smoker   . Smokeless tobacco: Not on file  . Alcohol Use: No  . Drug Use: No  . Sexual Activity: Yes    Birth Control/ Protection: None   Other Topics Concern  . Not on file   Social History Narrative   family history includes Diabetes in her maternal aunt, maternal grandfather, maternal grandmother, maternal uncle, and mother.  Current facility-administered medications:  .  0.9 %  sodium chloride infusion, , Intravenous, Continuous, Hillary BowJared M Gardner, DO, Last Rate: 125 mL/hr at 10/26/15 0852 .  sodium chloride 0.9 % injection 3 mL, 3 mL, Intravenous, Q12H, Hillary BowJared M Gardner, DO, 3 mL at 10/26/15 0310 .  topiramate (TOPAMAX) tablet 50 mg, 50 mg, Oral, QHS, Jared M Gardner, DO, 50 mg at 10/26/15 0251 No Known Allergies   Objective:     BP 112/65 mmHg  Pulse 89  Temp(Src) 98.1 F (36.7 C) (Oral)  Resp 18  Ht 5\' 6"  (1.676 m)  Wt 132.45 kg (292 lb)  BMI 47.15 kg/m2  SpO2 99%  LMP 09/18/2015  She is in no distress  Heart regular rhythm no murmurs  Lungs clear  Abdomen: Bowel sounds normal, soft,  nontender  Laboratory No components found for: D1    Assessment:     Rectal bleeding. There has been some drop in her hemoglobin. This could be a diverticular bleed. I do not believe this is an upper GI bleed.      Plan:     We will plan for colonoscopy tomorrow to further investigate. Lab Results  Component Value Date   HGB 11.7* 10/26/2015   HGB 12.7 10/25/2015   HGB 14.1 10/25/2015   HCT 36.1 10/26/2015   HCT 38.9 10/25/2015   HCT 43.6 10/25/2015   ALKPHOS 84 10/25/2015   AST 20 10/25/2015   ALT 18 10/25/2015

## 2015-10-26 NOTE — ED Notes (Signed)
Pt returned from CT °

## 2015-10-26 NOTE — H&P (Signed)
[facility] History and Physical  Christina NoseMichelle Wolanski UJW:[id number]:[id number] DOB: [birth year] DOA: 10/25/2015  Referring physician: EDP PCP: Arnette FeltsMoore, Janece   Chief Complaint: BRBPR   HPI: Christina Nicholson who presents to the ED for the 2nd time today with c/o ongoing BRBPR.  Patient reports 10 episodes of bloody BMs.  [facility], had 2 more episodes, and [facility] feeling hot and sluggish.  Abdominal cramping just before BM but no other abdominal pain.  Had cortisone shot earlier today just before all of this started.  Review of Systems: Systems reviewed.  As above, otherwise negative  Past Medical History  Diagnosis Date  . Trichomonas infection   . Hypertension    Past Surgical History  Procedure Laterality Date  . Dilation and curettage of uterus  2004   Social History:  reports that she has never smoked. She does not have any smokeless tobacco history on file. She reports that she does not drink alcohol or use illicit drugs.  No Known Allergies  Family History  Problem Relation Age of Onset  . Diabetes Mother   . Diabetes Maternal Aunt   . Diabetes Maternal Uncle   . Diabetes Maternal Grandmother   . Diabetes Maternal Grandfather      Prior to Admission medications   Medication Sig Start Date End Date Taking? Authorizing Provider  lisinopril (PRINIVIL,ZESTRIL) 10 MG tablet Take 10 mg by mouth daily.   Yes Historical Provider, MD  topiramate (TOPAMAX) 25 MG capsule Take 50 mg by mouth at bedtime.    Yes Historical Provider, MD  acetaminophen-codeine (TYLENOL #3) 300-30 MG tablet Take 1-2 tablets by mouth 2 (two) times daily as needed for moderate pain.    Historical Provider, MD  naproxen (NAPROSYN) 500 MG tablet Take 500 mg by mouth daily.    Historical Provider, MD   Physical Exam: Filed Vitals:   10/25/15 2253  BP: 152/93  Pulse: 121  Temp: 98.3 F (36.8 C)  Resp: 22    BP 152/93 mmHg  Pulse 121   Temp(Src) 98.3 F (36.8 C) (Oral)  Resp 22  SpO2 98%  LMP 09/18/2015  General Appearance:    Alert, oriented, no distress, appears stated age  Head:    Normocephalic, atraumatic  Eyes:    PERRL, EOMI, sclera non-icteric        Nicholson:   Nares without drainage or epistaxis. Mucosa, turbinates normal  Throat:   Moist mucous membranes. Oropharynx without erythema or exudate.  Neck:   Supple. No carotid bruits.  No thyromegaly.  No lymphadenopathy.   Back:     No CVA tenderness, no spinal tenderness  Lungs:     Clear to auscultation bilaterally, without wheezes, rhonchi or rales  Chest wall:    No tenderness to palpitation  Heart:    Regular rate and rhythm without murmurs, gallops, rubs  Abdomen:     Soft, non-tender, nondistended, normal bowel sounds, no organomegaly  Genitalia:    deferred  Rectal:    deferred  Extremities:   No clubbing, cyanosis or edema.  Pulses:   2+ and symmetric all extremities  Skin:   Skin color, texture, turgor normal, no rashes or lesions  Lymph nodes:   Cervical, supraclavicular, and axillary nodes normal  Neurologic:   CNII-XII intact. Normal strength, sensation and reflexes      throughout    Labs on Admission:  Basic Metabolic Panel:  Recent Labs Lab 10/25/15 1607  NA 142  K 3.4*  CL 110  CO2 22  GLUCOSE 101*  BUN 16  CREATININE 0.67  CALCIUM 9.4   Liver Function Tests:  Recent Labs Lab 10/25/15 1607  AST 20  ALT 18  ALKPHOS 84  BILITOT 0.4  PROT 7.7  ALBUMIN 3.7   No results for input(s): LIPASE, AMYLASE in the last 168 hours. No results for input(s): AMMONIA in the last 168 hours. CBC:  Recent Labs Lab 10/25/15 1607 10/25/15 2316  WBC 8.2 10.0  NEUTROABS  --  8.5*  HGB 14.1 12.7  HCT 43.6 38.9  MCV 80.7 80.7  PLT 211 278   Cardiac Enzymes: No results for input(s): CKTOTAL, CKMB, CKMBINDEX, TROPONINI in the last 168 hours.  BNP (last 3 results) No results for input(s): PROBNP in the last 8760 hours. CBG: No  results for input(s): GLUCAP in the last 168 hours.  Radiological Exams on Admission: Ct Abdomen Pelvis W Contrast  10/26/2015  CLINICAL DATA:  Rectal bleeding with abdominal cramping prior to bowel movements. EXAM: CT ABDOMEN AND PELVIS WITH CONTRAST TECHNIQUE: Multidetector CT imaging of the abdomen and pelvis was performed using the standard protocol following bolus administration of intravenous contrast. CONTRAST:  OMNIPAQUE IOHEXOL 300 MG/ML  SOLN COMPARISON:  None. FINDINGS: Lung bases are clear. Mild focal fatty infiltration adjacent to the falciform ligament of the liver. No other focal liver lesions identified. Gallbladder, spleen, pancreas, adrenal glands, kidneys, abdominal aorta, inferior vena cava, and retroperitoneal lymph nodes are unremarkable. Stomach, small bowel, and colon are not abnormally distended. No free air or free fluid in the abdomen. Pelvis: Appendix is normal. Uterus and ovaries are not enlarged. No free or loculated pelvic fluid collections. No pelvic mass or lymphadenopathy. Bladder wall is not thickened. Diverticula in the sigmoid colon without evidence of diverticulitis. No destructive bone lesions. IMPRESSION: No acute process demonstrated in the abdomen or pelvis. Scattered diverticula in the colon. No evidence of diverticulitis. Focal fatty infiltration in the liver. Electronically Signed   By: Burman Nieves M.D.   On: 10/26/2015 01:25    EKG: Independently reviewed.  Assessment/Plan Active Problems:   Rectal bleeding   1. Rectal bleeding - 1. Tele monitor for tachycardia 2. bolusing 2nd L IVF in ED for tachycardia 3. IVF at 125 cc/hr 4. Repeat CBC in AM 5. EDP calling GI for further instructions (PPI gtt?, etc). 6. Patient does have findings of diverticulosis of the colon on CT scan despite her young age. 7. NPO 8. Had been prescribed NSAIDS and has history of use, but hadnt been taking recently.    Code Status: Full  Family Communication:  Family at bedside Disposition Plan: Admit to inpatient   Time spent: 70 min  GARDNER, JARED M. [facility] Pager [phone]  If 7AM-7PM, please contact the day team taking care of the patient [email or web address] Password [email or web address] 10/26/2015, 1:51 AM

## 2015-10-26 NOTE — ED Notes (Signed)
Patient transported to CT 

## 2015-10-26 NOTE — Progress Notes (Signed)
I have seen and assessed the patient and agree with Dr Claudina LickGardner's assessmenrt and plan. Patient still with rectal bleeding likely diverticular in nature. Patient has been seen by GI and plans for colonoscopy tomorrow.

## 2015-10-27 ENCOUNTER — Encounter (HOSPITAL_COMMUNITY): Payer: Self-pay | Admitting: Internal Medicine

## 2015-10-27 ENCOUNTER — Inpatient Hospital Stay (HOSPITAL_COMMUNITY): Payer: 59 | Admitting: Anesthesiology

## 2015-10-27 ENCOUNTER — Encounter (HOSPITAL_COMMUNITY)
Admission: EM | Disposition: A | Discharge status: Home / Self Care | Payer: Self-pay | Source: Home / Self Care | Attending: Internal Medicine

## 2015-10-27 DIAGNOSIS — I10 Essential (primary) hypertension: Secondary | ICD-10-CM

## 2015-10-27 DIAGNOSIS — K922 Gastrointestinal hemorrhage, unspecified: Secondary | ICD-10-CM | POA: Diagnosis present

## 2015-10-27 DIAGNOSIS — D72829 Elevated white blood cell count, unspecified: Secondary | ICD-10-CM

## 2015-10-27 HISTORY — DX: Essential (primary) hypertension: I10

## 2015-10-27 HISTORY — PX: COLONOSCOPY WITH PROPOFOL: SHX5780

## 2015-10-27 LAB — MAGNESIUM: MAGNESIUM: 2.2 mg/dL (ref 1.7–2.4)

## 2015-10-27 LAB — URINALYSIS, ROUTINE W REFLEX MICROSCOPIC
BILIRUBIN URINE: NEGATIVE
Glucose, UA: NEGATIVE mg/dL
KETONES UR: NEGATIVE mg/dL
Leukocytes, UA: NEGATIVE
Nitrite: NEGATIVE
PH: 5.5 (ref 5.0–8.0)
Protein, ur: NEGATIVE mg/dL
SPECIFIC GRAVITY, URINE: 1.009 (ref 1.005–1.030)
UROBILINOGEN UA: 0.2 mg/dL (ref 0.0–1.0)

## 2015-10-27 LAB — BASIC METABOLIC PANEL
ANION GAP: 7 (ref 5–15)
BUN: 13 mg/dL (ref 6–20)
CALCIUM: 8.9 mg/dL (ref 8.9–10.3)
CO2: 25 mmol/L (ref 22–32)
Chloride: 111 mmol/L (ref 101–111)
Creatinine, Ser: 0.76 mg/dL (ref 0.44–1.00)
GFR calc Af Amer: >60 mL/min (ref >60)
GLUCOSE: 123 mg/dL — AB (ref 65–99)
POTASSIUM: 3.8 mmol/L (ref 3.5–5.1)
SODIUM: 143 mmol/L (ref 135–145)

## 2015-10-27 LAB — CBC WITH DIFFERENTIAL/PLATELET
BASOS ABS: 0 10*3/uL (ref 0.0–0.1)
BASOS PCT: 0 %
EOS ABS: 0 10*3/uL (ref 0.0–0.7)
EOS PCT: 0 %
HCT: 35.7 % — ABNORMAL LOW (ref 36.0–46.0)
Hemoglobin: 11.5 g/dL — ABNORMAL LOW (ref 12.0–15.0)
LYMPHS PCT: 16 %
Lymphs Abs: 2.5 10*3/uL (ref 0.7–4.0)
MCH: 26.4 pg (ref 26.0–34.0)
MCHC: 32.2 g/dL (ref 30.0–36.0)
MCV: 81.9 fL (ref 78.0–100.0)
MONO ABS: 0.5 10*3/uL (ref 0.1–1.0)
Monocytes Relative: 4 %
Neutro Abs: 12.4 10*3/uL — ABNORMAL HIGH (ref 1.7–7.7)
Neutrophils Relative %: 80 %
PLATELETS: 248 10*3/uL (ref 150–400)
RBC: 4.36 MIL/uL (ref 3.87–5.11)
RDW: 15.5 % (ref 11.5–15.5)
WBC: 15.5 10*3/uL — AB (ref 4.0–10.5)

## 2015-10-27 LAB — URINE MICROSCOPIC-ADD ON

## 2015-10-27 SURGERY — COLONOSCOPY WITH PROPOFOL
Anesthesia: Monitor Anesthesia Care

## 2015-10-27 MED ORDER — LACTATED RINGERS IV SOLN
INTRAVENOUS | Status: DC | PRN
  Administered 2015-10-27: 13:00:00 via INTRAVENOUS

## 2015-10-27 MED ORDER — PROPOFOL 10 MG/ML IV BOLUS
INTRAVENOUS | Status: DC | PRN
  Administered 2015-10-27: 20 mg via INTRAVENOUS
  Administered 2015-10-27: 30 mg via INTRAVENOUS
  Administered 2015-10-27: 40 mg via INTRAVENOUS
  Administered 2015-10-27: 60 mg via INTRAVENOUS
  Administered 2015-10-27 (×2): 40 mg via INTRAVENOUS
  Administered 2015-10-27: 20 mg via INTRAVENOUS
  Administered 2015-10-27: 50 mg via INTRAVENOUS
  Administered 2015-10-27: 20 mg via INTRAVENOUS
  Administered 2015-10-27: 40 mg via INTRAVENOUS
  Administered 2015-10-27: 50 mg via INTRAVENOUS
  Administered 2015-10-27: 30 mg via INTRAVENOUS
  Administered 2015-10-27: 40 mg via INTRAVENOUS
  Administered 2015-10-27: 20 mg via INTRAVENOUS
  Administered 2015-10-27 (×2): 40 mg via INTRAVENOUS

## 2015-10-27 MED ORDER — PROPOFOL 10 MG/ML IV BOLUS
INTRAVENOUS | Status: AC
  Filled 2015-10-27: qty 20

## 2015-10-27 MED ORDER — PROPOFOL 10 MG/ML IV BOLUS
INTRAVENOUS | Status: AC
Start: 2015-10-27 — End: 2015-10-27
  Filled 2015-10-27: qty 20

## 2015-10-27 MED ORDER — LACTATED RINGERS IV SOLN
Freq: Once | INTRAVENOUS | Status: AC
  Administered 2015-10-27: 1000 mL via INTRAVENOUS

## 2015-10-27 MED ORDER — SODIUM CHLORIDE 0.9 % IV SOLN: INTRAVENOUS | Status: DC

## 2015-10-27 SURGICAL SUPPLY — 22 items

## 2015-10-27 NOTE — Op Note (Signed)
[facility] [address] [place], [address]   COLONOSCOPY PROCEDURE REPORT     EXAM DATE: 11-26-15  PATIENT NAME:      Christina Nicholson, Christina Nicholson           MR #:      [id number]  BIRTHDATE:       [birth year]      VISIT #:     [id number]  ATTENDING:     Wandalee Ferdinand, MD     STATUS:     outpatient ASSISTANT:  INDICATIONS:  The patient is a 39 yr old female here for a colonoscopy due to ectal bleeding PROCEDURE PERFORMED:c     ololonoscopy MEDICATIONS:     propofol per anesthesia ESTIMATED BLOOD LOSS:     None  CONSENT: The patient understands the risks and benefits of the procedure and understands that these risks include, but are not limited to: sedation, allergic reaction, infection, perforation and/or bleeding. Alternative means of evaluation and treatment include, among others: physical exam, x-rays, and/or surgical intervention. The patient elects to proceed with this endoscopic procedure.  DESCRIPTION OF PROCEDURE: During intra-op preparation period all mechanical & medical equipment was checked for proper function. Hand hygiene and appropriate measures for infection prevention was taken. After the risks, benefits and alternatives of the procedure were thoroughly explained, Informed consent was verified, confirmed and timeout was successfully executed by the treatment team. A digital exam digital exam was negative      The Pentax colonoscope      endoscope was introduced through the anus and advanced to the cecum     . the prep was fair requiring a lot of suctioning of stool which was liquid. There was no evidence of gross active bleeding.      The instrument was then slowly withdrawn as the colon was fully examined.Estimated blood loss is zero unless otherwise noted in this procedure report. The scope was then completely withdrawn from the patient and the procedure terminated. SCOPE WITHDRAWAL TIME:  findings: The colonoscope was advanced from the  rectum to the cecum without difficulty. Cecal landmarks were identified by identification of the ileocecal valve and appendiceal orifice. The scope was brought out visualizing the mucosa. There were several diverticula noted in the proximal ascending colon.There was no evidence of active bleeding. The transverse colon looked normal. The descending colon and sigmoid showed a rare diverticulum but again no evidence of active bleeding. The rectum was normal. The cecum was normal.Retroflexion in the rectum was normal. There was no evidence of active bleeding    ADVERSE EVENTS:      There were no immediate complications.  IMPRESSIONS:     diverticulosisof the right colonand left colon. No evidence of active bleeding  RECOMMENDATIONS:     advance diet. Provided she is stable in tolerates her diet I think she can go home tomorrow. RECALL:           ecommend repeat screening colonoscopy in 10 years   _____________________________ Wandalee Ferdinand, MD eSigned:  Wandalee Ferdinand, MD November 26, 2015 1:53 PM   cc:   CPT CODES: ICD CODES:  The ICD and CPT codes recommended by this software are interpretations from the data that the clinical staff has captured with the software.  The verification of the translation of this report to the ICD and CPT codes and modifiers is the sole responsibility of the health care institution and practicing physician where this report was generated.  [employer] will not be held responsible  for the validity of the ICD and CPT codes included on this report.  AMA assumes no liability for data contained or not contained herein. CPT is a Publishing rights managerregistered trademark of the [employer].   PATIENT NAME:  Christina Nicholson, Christina Nicholson MR#: [id number]

## 2015-10-27 NOTE — Progress Notes (Signed)
[facility] PROGRESS NOTE  Christina Nicholson ZOX:[id number] DOB: [birth year] DOA: 10/25/2015 PCP: Arnette Felts  Assessment/Plan: #1 GI bleed/rectal bleed Likely lower GI bleed. Patient with diverticulosis noted on CT abdomen and pelvis. Patient with some abdominal cramping. Patient still with rectal bleeding. Hemoglobin currently at 11.5 and stable. Patient for colonoscopy today. Per GI.  #2 leukocytosis Questionable etiology. Likely reactive. Urinalysis is negative. Chest x-ray is negative. Patient is afebrile. Follow. If worsening leukocytosis may consider repeat CT of the abdomen and pelvis.  #3 hypertension Stable. Follow.  #4 prophylaxis SCDs for DVT prophylaxis.  Code Status: Full Family Communication: Updated patient and mother and family at bedside. Disposition Plan: Home once rectal bleeding has resolved.   Consultants:  Gastroenterology: Dr. Evette Cristal 10/26/2015  Procedures:  Chest x-ray 10/26/2015  CT abdomen and pelvis 10/26/2015  Antibiotics:  None  HPI/Subjective: Patient states still with rectal bleeding. No nausea, no emesis. Patient with abdominal cramping.  Objective: Filed Vitals:   10/27/15 1000  BP: 131/74  Pulse: 51  Temp: 98.1 F (36.7 C)  Resp:     Intake/Output Summary (Last 24 hours) at 10/27/15 1145 Last data filed at 10/27/15 0600  Gross per 24 hour  Intake   3375 ml  Output      0 ml  Net   3375 ml   Filed Weights   10/26/15 0225  Weight: 132.45 kg (292 lb)    Exam:   General:  NAD  Cardiovascular: RRR  Respiratory: CTAB  Abdomen: Soft/NT/ND/+BS/no rebound, no guarding  Musculoskeletal: No c/c/e  Data Reviewed: Basic Metabolic Panel:  Recent Labs Lab 10/25/15 1607 10/27/15 0506  NA 142 143  K 3.4* 3.8  CL 110 111  CO2 22 25  GLUCOSE 101* 123*  BUN 16 13  CREATININE 0.67 0.76  CALCIUM 9.4 8.9  MG  --  2.2   Liver Function Tests:  Recent Labs Lab 10/25/15 1607  AST 20  ALT 18  ALKPHOS 84   BILITOT 0.4  PROT 7.7  ALBUMIN 3.7   No results for input(s): LIPASE, AMYLASE in the last 168 hours. No results for input(s): AMMONIA in the last 168 hours. CBC:  Recent Labs Lab 10/25/15 1607 10/25/15 2316 10/26/15 0345 10/26/15 1531 10/27/15 0506  WBC 8.2 10.0 10.5 15.2* 15.5*  NEUTROABS  --  8.5*  --   --  12.4*  HGB 14.1 12.7 11.7* 11.8* 11.5*  HCT 43.6 38.9 36.1 36.0 35.7*  MCV 80.7 80.7 81.1 81.1 81.9  PLT 211 278 242 212 248   Cardiac Enzymes: No results for input(s): CKTOTAL, CKMB, CKMBINDEX, TROPONINI in the last 168 hours. BNP (last 3 results) No results for input(s): BNP in the last 8760 hours.  ProBNP (last 3 results) No results for input(s): PROBNP in the last 8760 hours.  CBG: No results for input(s): GLUCAP in the last 168 hours.  No results found for this or any previous visit (from the past 240 hour(s)).   Studies: Dg Chest 2 View  10/26/2015  CLINICAL DATA:  Left-sided chest pain for several days. Leukocytosis. EXAM: CHEST  2 VIEW COMPARISON:  None. FINDINGS: The heart size and mediastinal contours are within normal limits. Both lungs are clear. The visualized skeletal structures are unremarkable. IMPRESSION: No active cardiopulmonary disease. Electronically Signed   By: Myles Rosenthal M.D.   On: 10/26/2015 20:43   Ct Abdomen Pelvis W Contrast  10/26/2015  CLINICAL DATA:  Rectal bleeding with abdominal cramping prior to bowel movements. EXAM: CT ABDOMEN AND  PELVIS WITH CONTRAST TECHNIQUE: Multidetector CT imaging of the abdomen and pelvis was performed using the standard protocol following bolus administration of intravenous contrast. CONTRAST:  100mL OMNIPAQUE IOHEXOL 300 MG/ML  SOLN COMPARISON:  None. FINDINGS: Lung bases are clear. Mild focal fatty infiltration adjacent to the falciform ligament of the liver. No other focal liver lesions identified. Gallbladder, spleen, pancreas, adrenal glands, kidneys, abdominal aorta, inferior vena cava, and  retroperitoneal lymph nodes are unremarkable. Stomach, small bowel, and colon are not abnormally distended. No free air or free fluid in the abdomen. Pelvis: Appendix is normal. Uterus and ovaries are not enlarged. No free or loculated pelvic fluid collections. No pelvic mass or lymphadenopathy. Bladder wall is not thickened. Diverticula in the sigmoid colon without evidence of diverticulitis. No destructive bone lesions. IMPRESSION: No acute process demonstrated in the abdomen or pelvis. Scattered diverticula in the colon. No evidence of diverticulitis. Focal fatty infiltration in the liver. Electronically Signed   By: Burman NievesWilliam  Stevens M.D.   On: 10/26/2015 01:25    Scheduled Meds: . sodium chloride  3 mL Intravenous Q12H  . topiramate  50 mg Oral QHS   Continuous Infusions: . sodium chloride 125 mL/hr at 10/27/15 1056    Principal Problem:   GIB (gastrointestinal bleeding) Active Problems:   Rectal bleeding   HTN (hypertension)   Leukocytosis    Time spent: 35 mins    [facility] HammondHOMPSON,DANIEL MD [facility] Pager [phone]. If 7PM-7AM, please contact night-coverage at [email or web address], password [email or web address] 10/27/2015, 11:45 AM  LOS: 1 day

## 2015-10-27 NOTE — Anesthesia Preprocedure Evaluation (Addendum)
Anesthesia Evaluation  Patient identified by MRN, date of birth, ID band Patient awake    Reviewed: Allergy & Precautions, NPO status , Patient's Chart, lab work & pertinent test results  History of Anesthesia Complications Negative for: history of anesthetic complications  Airway Mallampati: II  TM Distance: >3 FB Neck ROM: Full    Dental  (+) Chipped, Poor Dentition, Dental Advisory Given   Pulmonary neg pulmonary ROS,    breath sounds clear to auscultation       Cardiovascular hypertension, Pt. on medications (-) angina Rhythm:Regular Rate:Normal     Neuro/Psych negative neurological ROS     GI/Hepatic Neg liver ROS, GI bleed   Endo/Other  Morbid obesity  Renal/GU negative Renal ROS     Musculoskeletal   Abdominal (+) + obese,   Peds  Hematology  (+) Blood dyscrasia (Hb 11.5), ,   Anesthesia Other Findings   Reproductive/Obstetrics 10/26/15 preg test: neg                           Anesthesia Physical Anesthesia Plan  ASA: III  Anesthesia Plan: MAC   Post-op Pain Management:    Induction: Intravenous  Airway Management Planned: Natural Airway and Nasal Cannula  Additional Equipment:   Intra-op Plan:   Post-operative Plan:   Informed Consent: I have reviewed the patients History and Physical, chart, labs and discussed the procedure including the risks, benefits and alternatives for the proposed anesthesia with the patient or authorized representative who has indicated his/her understanding and acceptance.   Dental advisory given  Plan Discussed with: CRNA and Surgeon  Anesthesia Plan Comments: (Plan routine monitors, MAC)        Anesthesia Quick Evaluation

## 2015-10-27 NOTE — Anesthesia Postprocedure Evaluation (Signed)
  Anesthesia Post-op Note  Patient: Christina Nicholson  Procedure(s) Performed: Procedure(s): COLONOSCOPY WITH PROPOFOL (N/A)  Patient Location: Endoscopy Unit  Anesthesia Type:MAC  Level of Consciousness: awake, alert , oriented and patient cooperative  Airway and Oxygen Therapy: Patient Spontanous Breathing  Post-op Pain: none  Post-op Assessment: Post-op Vital signs reviewed, Patient's Cardiovascular Status Stable, Respiratory Function Stable, Patent Airway, No signs of Nausea or vomiting and Pain level controlled              Post-op Vital Signs: Reviewed and stable  Last Vitals:  Filed Vitals:   10/27/15 1416  BP: 117/63  Pulse:   Temp:   Resp:     Complications: No apparent anesthesia complications

## 2015-10-27 NOTE — Transfer of Care (Signed)
Immediate Anesthesia Transfer of Care Note  Patient: Charlett NoseMichelle Sobol  Procedure(s) Performed: Procedure(s): COLONOSCOPY WITH PROPOFOL (N/A)  Patient Location: PACU  Anesthesia Type:MAC  Level of Consciousness: sedated  Airway & Oxygen Therapy: Patient Spontanous Breathing and Patient connected to nasal cannula oxygen  Post-op Assessment: Report given to RN and Post -op Vital signs reviewed and stable  Post vital signs: Reviewed and stable  Last Vitals:  Filed Vitals:   10/27/15 1214  BP: 130/64  Pulse:   Temp:   Resp:     Complications: No apparent anesthesia complications

## 2015-10-28 ENCOUNTER — Encounter (HOSPITAL_COMMUNITY): Payer: Self-pay | Admitting: Gastroenterology

## 2015-10-28 LAB — BASIC METABOLIC PANEL
ANION GAP: 7 (ref 5–15)
BUN: 17 mg/dL (ref 6–20)
CALCIUM: 8.3 mg/dL — AB (ref 8.9–10.3)
CHLORIDE: 112 mmol/L — AB (ref 101–111)
CO2: 23 mmol/L (ref 22–32)
Creatinine, Ser: 0.74 mg/dL (ref 0.44–1.00)
GFR calc non Af Amer: >60 mL/min (ref >60)
Glucose, Bld: 101 mg/dL — ABNORMAL HIGH (ref 65–99)
Potassium: 3.7 mmol/L (ref 3.5–5.1)
Sodium: 142 mmol/L (ref 135–145)

## 2015-10-28 LAB — CBC
HEMATOCRIT: 31.4 % — AB (ref 36.0–46.0)
HEMOGLOBIN: 10 g/dL — AB (ref 12.0–15.0)
MCH: 26.4 pg (ref 26.0–34.0)
MCHC: 31.8 g/dL (ref 30.0–36.0)
MCV: 82.8 fL (ref 78.0–100.0)
Platelets: 196 10*3/uL (ref 150–400)
RBC: 3.79 MIL/uL — ABNORMAL LOW (ref 3.87–5.11)
RDW: 15.8 % — AB (ref 11.5–15.5)
WBC: 10 10*3/uL (ref 4.0–10.5)

## 2015-10-28 LAB — URINE CULTURE

## 2015-10-28 NOTE — Progress Notes (Signed)
[facility] PROGRESS NOTE  Christina NoseMichelle Nicholson WUJ:[id number] DOB: [birth year] DOA: 10/25/2015 PCP: Christina Nicholson  Assessment/Plan: #1 GI bleed/rectal bleed Likely lower GI bleed. Patient with diverticulosis noted on CT abdomen and pelvis. Patient with decreasing rectal bleeding. Patient with 2 bleeding episodes last night none today. Hemoglobin currently at 10.0 from 11.5 yesterday. Patient status post colonoscopy with no site of active bleeding noted however patient noted to have diverticulosis in the colon. Patient tolerating current diet. Follow H&H. Per GI.  #2 leukocytosis Questionable etiology. Likely reactive. Urinalysis is negative. Chest x-ray is negative. Patient is afebrile. WBC has trended down. Follow.   #3 hypertension Stable. Follow.  #4 prophylaxis SCDs for DVT prophylaxis.  Code Status: Full Family Communication: Updated patient. No family at bedside. Disposition Plan: Home once rectal bleeding has resolved and hemoglobin stable hopefully tomorrow.   Consultants:  Gastroenterology: Dr. Evette Nicholson 10/26/2015  Procedures:  Chest x-ray 10/26/2015  CT abdomen and pelvis 10/26/2015  Colonoscopy Dr. Evette Nicholson 10/27/2015  Antibiotics:  None  HPI/Subjective: Patient states had 2 episodes of rectal bleeding last night. No bowel movement today. No nausea. No emesis. Abdominal cramping improving. Tolerating current diet.   Objective: Filed Vitals:   10/28/15 0626  BP: 108/60  Pulse: 59  Temp: 98.1 F (36.7 C)  Resp: 18    Intake/Output Summary (Last 24 hours) at 10/28/15 1429 Last data filed at 10/28/15 0600  Gross per 24 hour  Intake 2693.75 ml  Output      0 ml  Net 2693.75 ml   Filed Weights   10/26/15 0225  Weight: 132.45 kg (292 lb)    Exam:   General:  NAD  Cardiovascular: RRR  Respiratory: CTAB  Abdomen: Soft/NT/ND/+BS/no rebound, no guarding  Musculoskeletal: No c/c/e  Data Reviewed: Basic Metabolic Panel:  Recent Labs Lab  10/25/15 1607 10/27/15 0506 10/28/15 0541  NA 142 143 142  K 3.4* 3.8 3.7  CL 110 111 112*  CO2 22 25 23   GLUCOSE 101* 123* 101*  BUN 16 13 17   CREATININE 0.67 0.76 0.74  CALCIUM 9.4 8.9 8.3*  MG  --  2.2  --    Liver Function Tests:  Recent Labs Lab 10/25/15 1607  AST 20  ALT 18  ALKPHOS 84  BILITOT 0.4  PROT 7.7  ALBUMIN 3.7   No results for input(s): LIPASE, AMYLASE in the last 168 hours. No results for input(s): AMMONIA in the last 168 hours. CBC:  Recent Labs Lab 10/25/15 2316 10/26/15 0345 10/26/15 1531 10/27/15 0506 10/28/15 0541  WBC 10.0 10.5 15.2* 15.5* 10.0  NEUTROABS 8.5*  --   --  12.4*  --   HGB 12.7 11.7* 11.8* 11.5* 10.0*  HCT 38.9 36.1 36.0 35.7* 31.4*  MCV 80.7 81.1 81.1 81.9 82.8  PLT 278 242 212 248 196   Cardiac Enzymes: No results for input(s): CKTOTAL, CKMB, CKMBINDEX, TROPONINI in the last 168 hours. BNP (last 3 results) No results for input(s): BNP in the last 8760 hours.  ProBNP (last 3 results) No results for input(s): PROBNP in the last 8760 hours.  CBG: No results for input(s): GLUCAP in the last 168 hours.  Recent Results (from the past 240 hour(s))  Urine culture     Status: None   Collection Time: 10/27/15 12:19 AM  Result Value Ref Range Status   Specimen Description URINE, RANDOM  Final   Special Requests NONE  Final   Culture   Final    MULTIPLE SPECIES PRESENT, SUGGEST RECOLLECTION Performed at  [facility]    Report Status 10/28/2015 FINAL  Final     Studies: Dg Chest 2 View  10/26/2015  CLINICAL DATA:  Left-sided chest pain for several days. Leukocytosis. EXAM: CHEST  2 VIEW COMPARISON:  None. FINDINGS: The heart size and mediastinal contours are within normal limits. Both lungs are clear. The visualized skeletal structures are unremarkable. IMPRESSION: No active cardiopulmonary disease. Electronically Signed   By: Christina Nicholson M.D.   On: 10/26/2015 20:43    Scheduled Meds: . sodium chloride  3 mL  Intravenous Q12H  . topiramate  50 mg Oral QHS   Continuous Infusions: . sodium chloride 125 mL/hr at 10/28/15 1412    Principal Problem:   GIB (gastrointestinal bleeding) Active Problems:   Rectal bleeding   HTN (hypertension)   Leukocytosis    Time spent: 35 mins    [facility] MD [facility] Pager [phone]. If 7PM-7AM, please contact night-coverage at [email or web address], password [facility] 10/28/2015, 2:29 PM  LOS: 2 days

## 2015-10-29 LAB — CBC
HEMATOCRIT: 30.8 % — AB (ref 36.0–46.0)
HEMOGLOBIN: 9.6 g/dL — AB (ref 12.0–15.0)
MCH: 25.5 pg — AB (ref 26.0–34.0)
MCHC: 31.2 g/dL (ref 30.0–36.0)
MCV: 81.7 fL (ref 78.0–100.0)
Platelets: 180 10*3/uL (ref 150–400)
RBC: 3.77 MIL/uL — AB (ref 3.87–5.11)
RDW: 15.5 % (ref 11.5–15.5)
WBC: 10.1 10*3/uL (ref 4.0–10.5)

## 2015-10-29 NOTE — Discharge Summary (Signed)
Physician Discharge Summary  Christina Nicholson ZOX:[id number] DOB: [birth year] DOA: 10/25/2015  PCP: Arnette Felts  Admit date: 10/25/2015 Discharge date: 10/29/2015  Time spent: 65 minutes  Recommendations for Outpatient Follow-up:  1. Follow-up with Arnette Felts in 1-2 weeks. Follow-up patient patient will need a CBC done to follow-up on H&H.  Discharge Diagnoses:  Principal Problem:   GIB (gastrointestinal bleeding) Active Problems:   Rectal bleeding   HTN (hypertension)   Leukocytosis   Discharge Condition: Stable and improved  Diet recommendation: Regular  Filed Weights   10/26/15 0225  Weight: 132.45 kg (292 lb)    History of present illness:  Per Dr Rozanna Boer is a 39 y.o. female who presented to the ED for the 2nd time on the day of admission, with c/o ongoing BRBPR. Patient reported 10 episodes of bloody BMs. [facility], discharged, had 2 more episodes, and now back to [facility] this time feeling hot and sluggish. Abdominal cramping just before BM but no other abdominal pain. Had cortisone shot earlier today just before all of this started.   Hospital Course:  #1 GI bleed/rectal bleed Patient had presented with recurrent rectal bleeding and was admitted. Patient was placed on IV fluids. Patient was initially made nothing by mouth and GI consulted. It was felt patient's rectal bleeding was likely a lower GI bleed likely secondary to diverticular bleed. CT abdomen and pelvis which was done on 10/26/2015 did show diverticulosis. Patient's rectal bleeding decreased daily. Patient's hemoglobin trended slowly down and stabilized, by day of discharge was 9.6 from 12.7 on admission. Patient underwent a colonoscopy on 10/27/2015 with no site of active bleeding noted however patient noted to have diverticulosis in the colon. Patient's diet was advanced which she tolerated. Patient did not have any further bloody bowel movements by day of discharge and had a  normal bowel movement. Patient be discharged home in stable and improved condition will follow-up with PCP as outpatient.   #2 leukocytosis Questionable etiology. Likely reactive. Urinalysis is negative. Chest x-ray is negative. Patient remained afebrile. WBC has trended down and leukocytosis had resolved by day of discharge.  #3 hypertension Stable. Outpatient follow up.  Procedures:  Chest x-ray 10/26/2015  CT abdomen and pelvis 10/26/2015  Colonoscopy Dr. Evette Cristal 10/27/2015    Consultations:  Gastroenterology: Dr. Evette Cristal 10/26/2015  Discharge Exam: Filed Vitals:   10/29/15 0716  BP: 126/54  Pulse: 66  Temp: 98.3 F (36.8 C)  Resp: 18    General: NAD Cardiovascular: RRR Respiratory: CTAB  Discharge Instructions   Discharge Instructions    Diet general    Complete by:  As directed   Soft.     Discharge instructions    Complete by:  As directed   Follow up with Arnette Felts in 1-2 weeks.     Increase activity slowly    Complete by:  As directed           Current Discharge Medication List    CONTINUE these medications which have NOT CHANGED   Details  lisinopril (PRINIVIL,ZESTRIL) 10 MG tablet Take 10 mg by mouth daily.    topiramate (TOPAMAX) 25 MG capsule Take 50 mg by mouth at bedtime.     acetaminophen-codeine (TYLENOL #3) 300-30 MG tablet Take 1-2 tablets by mouth 2 (two) times daily as needed for moderate pain.      STOP taking these medications     naproxen (NAPROSYN) 500 MG tablet        No Known Allergies  The results of significant diagnostics from this hospitalization (including imaging, microbiology, ancillary and laboratory) are listed below for reference.    Significant Diagnostic Studies: Dg Chest 2 View  10/26/2015  CLINICAL DATA:  Left-sided chest pain for several days. Leukocytosis. EXAM: CHEST  2 VIEW COMPARISON:  None. FINDINGS: The heart size and mediastinal contours are within normal limits. Both lungs are clear. The  visualized skeletal structures are unremarkable. IMPRESSION: No active cardiopulmonary disease. Electronically Signed   By: Myles RosenthalJohn  Stahl M.D.   On: 10/26/2015 20:43   Ct Abdomen Pelvis W Contrast  10/26/2015  CLINICAL DATA:  Rectal bleeding with abdominal cramping prior to bowel movements. EXAM: CT ABDOMEN AND PELVIS WITH CONTRAST TECHNIQUE: Multidetector CT imaging of the abdomen and pelvis was performed using the standard protocol following bolus administration of intravenous contrast. CONTRAST:  100mL OMNIPAQUE IOHEXOL 300 MG/ML  SOLN COMPARISON:  None. FINDINGS: Lung bases are clear. Mild focal fatty infiltration adjacent to the falciform ligament of the liver. No other focal liver lesions identified. Gallbladder, spleen, pancreas, adrenal glands, kidneys, abdominal aorta, inferior vena cava, and retroperitoneal lymph nodes are unremarkable. Stomach, small bowel, and colon are not abnormally distended. No free air or free fluid in the abdomen. Pelvis: Appendix is normal. Uterus and ovaries are not enlarged. No free or loculated pelvic fluid collections. No pelvic mass or lymphadenopathy. Bladder wall is not thickened. Diverticula in the sigmoid colon without evidence of diverticulitis. No destructive bone lesions. IMPRESSION: No acute process demonstrated in the abdomen or pelvis. Scattered diverticula in the colon. No evidence of diverticulitis. Focal fatty infiltration in the liver. Electronically Signed   By: Burman NievesWilliam  Stevens M.D.   On: 10/26/2015 01:25   Mr Shoulder Left Wo Contrast  10/02/2015  CLINICAL DATA:  Left shoulder pain and weakness with limited range of motion. EXAM: MRI OF THE LEFT SHOULDER WITHOUT CONTRAST TECHNIQUE: Multiplanar, multisequence MR imaging of the shoulder was performed. No intravenous contrast was administered. COMPARISON:  Radiographs dated 04/26/2015 FINDINGS: Rotator cuff:  Normal. Muscles: There is a linear intrasubstance tear of the anterior aspect of the deltoid muscle  measuring 6 mm in width in 6 cm in length originating from the anterior aspect of the distal clavicle extending inferiorly anterior to the humeral head in the biceps tendon. The muscles are otherwise normal. Biceps long head:  Properly located and intact. Acromioclavicular Joint:  Normal.  Type 2 acromion.  No bursitis. Glenohumeral Joint: Normal. Labrum:  Normal. Bones:  Normal. IMPRESSION: Linear intrasubstance tear of the anterior aspect of the deltoid muscle I suspect this is relatively acute. Otherwise, normal exam. Electronically Signed   By: Francene BoyersJames  Maxwell M.D.   On: 10/02/2015 20:07    Microbiology: Recent Results (from the past 240 hour(s))  Urine culture     Status: None   Collection Time: 10/27/15 12:19 AM  Result Value Ref Range Status   Specimen Description URINE, RANDOM  Final   Special Requests NONE  Final   Culture   Final    MULTIPLE SPECIES PRESENT, SUGGEST RECOLLECTION [facility]    Report Status 10/28/2015 FINAL  Final     Labs: Basic Metabolic Panel:  Recent Labs Lab 10/25/15 1607 10/27/15 0506 10/28/15 0541  NA 142 143 142  K 3.4* 3.8 3.7  CL 110 111 112*  CO2 22 25 23   GLUCOSE 101* 123* 101*  BUN 16 13 17   CREATININE 0.67 0.76 0.74  CALCIUM 9.4 8.9 8.3*  MG  --  2.2  --  Liver Function Tests:  Recent Labs Lab 10/25/15 1607  AST 20  ALT 18  ALKPHOS 84  BILITOT 0.4  PROT 7.7  ALBUMIN 3.7   No results for input(s): LIPASE, AMYLASE in the last 168 hours. No results for input(s): AMMONIA in the last 168 hours. CBC:  Recent Labs Lab 10/25/15 2316 10/26/15 0345 10/26/15 1531 10/27/15 0506 10/28/15 0541 10/29/15 0505  WBC 10.0 10.5 15.2* 15.5* 10.0 10.1  NEUTROABS 8.5*  --   --  12.4*  --   --   HGB 12.7 11.7* 11.8* 11.5* 10.0* 9.6*  HCT 38.9 36.1 36.0 35.7* 31.4* 30.8*  MCV 80.7 81.1 81.1 81.9 82.8 81.7  PLT 278 242 212 248 196 180   Cardiac Enzymes: No results for input(s): CKTOTAL, CKMB, CKMBINDEX, TROPONINI  in the last 168 hours. BNP: BNP (last 3 results) No results for input(s): BNP in the last 8760 hours.  ProBNP (last 3 results) No results for input(s): PROBNP in the last 8760 hours.  CBG: No results for input(s): GLUCAP in the last 168 hours.     SignedRamiro Harvest MD [facility] 10/29/2015, 11:28 AM

## 2015-10-29 NOTE — Progress Notes (Signed)
Pt d/c information given with no questions/concerns voiced.  Transported via wheelchair by NT and family to private vehicle.

## 2016-01-11 ENCOUNTER — Telehealth: Payer: Self-pay

## 2016-01-11 NOTE — Telephone Encounter (Signed)
Waiting on payment of 19.25 for 27 pages.  From [employer]

## 2016-01-20 DIAGNOSIS — Z0271 Encounter for disability determination: Secondary | ICD-10-CM

## 2016-01-20 NOTE — Telephone Encounter (Signed)
Payment received and records were faxed on 01/20/16.

## 2016-04-24 ENCOUNTER — Emergency Department (HOSPITAL_COMMUNITY)
Admission: EM | Admit: 2016-04-24 | Discharge: 2016-04-24 | Disposition: A | Discharge status: Home / Self Care | Payer: 59 | Attending: Emergency Medicine | Admitting: Emergency Medicine

## 2016-04-24 ENCOUNTER — Encounter (HOSPITAL_COMMUNITY): Payer: Self-pay | Admitting: Emergency Medicine

## 2016-04-24 DIAGNOSIS — Z79899 Other long term (current) drug therapy: Secondary | ICD-10-CM | POA: Insufficient documentation

## 2016-04-24 DIAGNOSIS — L0291 Cutaneous abscess, unspecified: Secondary | ICD-10-CM

## 2016-04-24 DIAGNOSIS — Z7984 Long term (current) use of oral hypoglycemic drugs: Secondary | ICD-10-CM | POA: Insufficient documentation

## 2016-04-24 DIAGNOSIS — L02416 Cutaneous abscess of left lower limb: Secondary | ICD-10-CM | POA: Insufficient documentation

## 2016-04-24 DIAGNOSIS — I10 Essential (primary) hypertension: Secondary | ICD-10-CM | POA: Insufficient documentation

## 2016-04-24 DIAGNOSIS — Z791 Long term (current) use of non-steroidal anti-inflammatories (NSAID): Secondary | ICD-10-CM | POA: Insufficient documentation

## 2016-04-24 DIAGNOSIS — Z792 Long term (current) use of antibiotics: Secondary | ICD-10-CM | POA: Insufficient documentation

## 2016-04-24 MED ORDER — LIDOCAINE HCL (PF) 1 % IJ SOLN: 5.0000 mL | Freq: Once | INTRAMUSCULAR | Status: DC

## 2016-04-24 MED ORDER — OXYCODONE-ACETAMINOPHEN 5-325 MG PO TABS
1.0000 | ORAL_TABLET | Freq: Once | ORAL | Status: AC
  Administered 2016-04-24: 1 via ORAL
  Filled 2016-04-24: qty 1

## 2016-04-24 MED ORDER — LIDOCAINE HCL 1 % IJ SOLN
INTRAMUSCULAR | Status: AC
  Administered 2016-04-24: 20 mL
  Filled 2016-04-24: qty 20

## 2016-04-24 MED ORDER — DOXYCYCLINE HYCLATE 100 MG PO CAPS: 100.0000 mg | ORAL_CAPSULE | Freq: Two times a day (BID) | ORAL | Status: AC

## 2016-04-24 NOTE — ED Provider Notes (Signed)
CSN: [id number]     Arrival date & time 04/24/16  4098 History   First MD Initiated Contact with Patient 04/24/16 [phone]     Chief Complaint  Patient presents with  . Abscess   Patient is a 40 y.o. female presenting with abscess.  Abscess Location:  Leg Leg abscess location:  L upper leg Size:  4 cm Abscess quality: fluctuance, induration, painful and redness   Abscess quality: not draining, no warmth and not weeping   Red streaking: no   Duration:  5 days Progression:  Worsening Chronicity:  Recurrent Relieved by:  Nothing Ineffective treatments:  Warm compresses and oral antibiotics Associated symptoms: no fever, no nausea and no vomiting   Risk factors: prior abscess    Ms. Fulp is a 40 year old female presenting with an abscess. The abscess is located on the left lateral thigh. There is been present for the past 5 days. She reports it is growing in size and becoming more tender. She saw an urgent care 2 days ago who discharged her with Keflex and told her to follow up with surgery given history of recurrent abscesses. They did not drain the abscess. She has been using hot compresses and taking the antibiotics as prescribed without relief. She reports history of abscesses and most frequently gets them in her axillae. She has no other complaints today. Denies fevers, chills, nausea or vomiting.  Past Medical History  Diagnosis Date  . Trichomonas infection   . Hypertension   . HTN (hypertension) 10/27/2015   Past Surgical History  Procedure Laterality Date  . Dilation and curettage of uterus  2004  . Colonoscopy with propofol N/A 10/27/2015    Procedure: COLONOSCOPY WITH PROPOFOL;  Surgeon: Graylin Shiver, MD;  Location: [facility] ENDOSCOPY;  Service: Endoscopy;  Laterality: N/A;   Family History  Problem Relation Age of Onset  . Diabetes Mother   . Diabetes Maternal Aunt   . Diabetes Maternal Uncle   . Diabetes Maternal Grandmother   . Diabetes Maternal Grandfather    Social  History  Substance Use Topics  . Smoking status: Never Smoker   . Smokeless tobacco: None  . Alcohol Use: No   OB History    Gravida Para Term Preterm AB TAB SAB Ectopic Multiple Living   0         0     Review of Systems  Constitutional: Negative for fever.  Gastrointestinal: Negative for nausea and vomiting.  All other systems reviewed and are negative.     Allergies  Review of patient's allergies indicates no known allergies.  Home Medications   Prior to Admission medications   Medication Sig Start Date End Date Taking? Authorizing Provider  cephALEXin (KEFLEX) 500 MG capsule Take 500 mg by mouth 2 (two) times daily.   Yes Historical Provider, MD  Diclofenac Sodium 1.5 % SOLN apply 10 drops to affected area and rub in, repeat 3 times for 1 dose, use up  to 4 times per day 04/04/16  Yes Historical Provider, MD  lidocaine (XYLOCAINE) 5 % ointment Apply 1-2 grams to the affected area 3-4 times daily as needed pain. 04/04/16  Yes Historical Provider, MD  lisinopril (PRINIVIL,ZESTRIL) 10 MG tablet Take 10 mg by mouth daily.   Yes Historical Provider, MD  metFORMIN (GLUCOPHAGE) 500 MG tablet Take 500 mg by mouth 2 (two) times daily with a meal.   Yes Historical Provider, MD  oxyCODONE-acetaminophen (PERCOCET/ROXICET) 5-325 MG tablet Take 1 tablet by mouth every 4 (four)  hours as needed for moderate pain or severe pain.   Yes Historical Provider, MD  sulfamethoxazole-trimethoprim (BACTRIM DS,SEPTRA DS) 800-160 MG tablet Take 1 tablet by mouth 2 (two) times daily.   Yes Historical Provider, MD  acetaminophen-codeine (TYLENOL #3) 300-30 MG tablet Take 1-2 tablets by mouth 2 (two) times daily as needed for moderate pain.    Historical Provider, MD  doxycycline (VIBRAMYCIN) 100 MG capsule Take 1 capsule (100 mg total) by mouth 2 (two) times daily. 04/24/16   Paiden Caraveo, PA-C   BP 129/86 mmHg  Pulse 87  Temp(Src) 98.3 F (36.8 C) (Oral)  Resp 14  Ht 5\' 6"  (1.676 m)  Wt 122.471 kg   BMI 43.60 kg/m2  SpO2 100% Physical Exam  Constitutional: She appears well-developed and well-nourished. No distress.  HENT:  Head: Normocephalic and atraumatic.  Right Ear: External ear normal.  Left Ear: External ear normal.  Eyes: Conjunctivae are normal. Right eye exhibits no discharge. Left eye exhibits no discharge. No scleral icterus.  Neck: Normal range of motion.  Cardiovascular: Normal rate.   Pulmonary/Chest: Effort normal.  Musculoskeletal: Normal range of motion.  Moves all extremities spontaneously  Neurological: She is alert. Coordination normal.  Skin: Skin is warm and dry.  4 cm area of fluctuance noted to the left lateral thigh. Surrounding induration. No active drainage. Overlying erythema of the fluctuance without streaking up or down the leg. No warmth.  Psychiatric: She has a normal mood and affect. Her behavior is normal.  Nursing note and vitals reviewed.   ED Course  .Marland Kitchen.Incision and Drainage Date/Time: 04/24/2016 11:29 AM Performed by: Alveta HeimlichBARRETT, Caia Lofaro Authorized by: Alveta HeimlichBARRETT, Gearldene Fiorenza Consent: Verbal consent obtained. Risks and benefits: risks, benefits and alternatives were discussed Consent given by: patient Patient understanding: patient states understanding of the procedure being performed Patient consent: the patient's understanding of the procedure matches consent given Procedure consent: procedure consent matches procedure scheduled Required items: required blood products, implants, devices, and special equipment available Patient identity confirmed: verbally with patient Type: abscess Body area: lower extremity Location details: left leg Anesthesia: local infiltration Local anesthetic: lidocaine 1% without epinephrine Anesthetic total: 5 ml Patient sedated: no Scalpel size: 11 Incision type: single straight Incision depth: dermal Complexity: simple Drainage: purulent Drainage amount: copious Wound treatment: wound left open Packing material:  1/4 in iodoform gauze Patient tolerance: Patient tolerated the procedure well with no immediate complications   (including critical care time) Labs Review Labs Reviewed - No data to display  Imaging Review No results found. I have personally reviewed and evaluated these images and lab results as part of my medical decision-making.   EKG Interpretation None      MDM   Final diagnoses:  Abscess   Patient presenting with skin abscess of the left lateral thigh amenable to incision and drainage. Patient tolerated the procedure well. Packing inserted. Discharged with doxy. Encouraged warm soaks at home and keeping the wound clean and dry. Instructed to go to PCP or urgent care in 2 days for wound recheck. Patient has follow up with general surgery to discuss recurrent abscesses. Patient expresses understanding and is stable for discharge.      Alveta HeimlichStevi Miner Koral, PA-C 04/24/16 1151  Lorre NickAnthony Allen, MD 04/27/16 1416

## 2016-04-24 NOTE — Discharge Instructions (Signed)
Abscess °An abscess is an infected area that contains a collection of pus and debris. It can occur in almost any part of the body. An abscess is also known as a furuncle or boil. °CAUSES  °An abscess occurs when tissue gets infected. This can occur from blockage of oil or sweat glands, infection of hair follicles, or a minor injury to the skin. As the body tries to fight the infection, pus collects in the area and creates pressure under the skin. This pressure causes pain. People with weakened immune systems have difficulty fighting infections and get certain abscesses more often.  °SYMPTOMS °Usually an abscess develops on the skin and becomes a painful mass that is red, warm, and tender. If the abscess forms under the skin, you may feel a moveable soft area under the skin. Some abscesses break open (rupture) on their own, but most will continue to get worse without care. The infection can spread deeper into the body and eventually into the bloodstream, causing you to feel ill.  °DIAGNOSIS  °Your caregiver will take your medical history and perform a physical exam. A sample of fluid may also be taken from the abscess to determine what is causing your infection. °TREATMENT  °Your caregiver may prescribe antibiotic medicines to fight the infection. However, taking antibiotics alone usually does not cure an abscess. Your caregiver may need to make a small cut (incision) in the abscess to drain the pus. In some cases, gauze is packed into the abscess to reduce pain and to continue draining the area. °HOME CARE INSTRUCTIONS  °· Only take over-the-counter or prescription medicines for pain, discomfort, or fever as directed by your caregiver. °· If you were prescribed antibiotics, take them as directed. Finish them even if you start to feel better. °· If gauze is used, follow your caregiver's directions for changing the gauze. °· To avoid spreading the infection: °· Keep your draining abscess covered with a  bandage. °· Wash your hands well. °· Do not share personal care items, towels, or whirlpools with others. °· Avoid skin contact with others. °· Keep your skin and clothes clean around the abscess. °· Keep all follow-up appointments as directed by your caregiver. °SEEK MEDICAL CARE IF:  °· You have increased pain, swelling, redness, fluid drainage, or bleeding. °· You have muscle aches, chills, or a general ill feeling. °· You have a fever. °MAKE SURE YOU:  °· Understand these instructions. °· Will watch your condition. °· Will get help right away if you are not doing well or get worse. °  °This information is not intended to replace advice given to you by your health care provider. Make sure you discuss any questions you have with your health care provider. °  °Document Released: 09/19/2005 Document Revised: 06/10/2012 Document Reviewed: 02/22/2012 °Elsevier Interactive Patient Education ©2016 [employer] ° °Incision and Drainage °Incision and drainage is a procedure in which a sac-like structure (cystic structure) is opened and drained. The area to be drained usually contains material such as pus, fluid, or blood.  °LET YOUR CAREGIVER KNOW ABOUT:  °· Allergies to medicine. °· Medicines taken, including vitamins, herbs, eyedrops, over-the-counter medicines, and creams. °· Use of steroids (by mouth or creams). °· Previous problems with anesthetics or numbing medicines. °· History of bleeding problems or blood clots. °· Previous surgery. °· Other health problems, including diabetes and kidney problems. °· Possibility of pregnancy, if this applies. °RISKS AND COMPLICATIONS °· Pain. °· Bleeding. °· Scarring. °· Infection. °BEFORE THE PROCEDURE  °  You may need to have an ultrasound or other imaging tests to see how large or deep your cystic structure is. Blood tests may also be used to determine if you have an infection or how severe the infection is. You may need to have a tetanus shot. °PROCEDURE  °The affected area  is cleaned with a cleaning fluid. The cyst area will then be numbed with a medicine (local anesthetic). A small incision will be made in the cystic structure. A syringe or catheter may be used to drain the contents of the cystic structure, or the contents may be squeezed out. The area will then be flushed with a cleansing solution. After cleansing the area, it is often gently packed with a gauze or another wound dressing. Once it is packed, it will be covered with gauze and tape or some other type of wound dressing.  °AFTER THE PROCEDURE  °· Often, you will be allowed to go home right after the procedure. °· You may be given antibiotic medicine to prevent or heal an infection. °· If the area was packed with gauze or some other wound dressing, you will likely need to come back in 1 to 2 days to get it removed. °· The area should heal in about 14 days. °  °This information is not intended to replace advice given to you by your health care provider. Make sure you discuss any questions you have with your health care provider. °  °Document Released: 06/05/2001 Document Revised: 06/10/2012 Document Reviewed: 02/04/2012 °Elsevier Interactive Patient Education ©2016 [employer]vier Inc[employer] ° °

## 2016-04-24 NOTE — ED Notes (Signed)
Boil on leg last Thursday, went to urgent care and received antibiotics Sunday with referral to follow up with surgery. She says no one has called her back and she's having worsening pain. Large area to left upper thigh with swelling/redness around the head of it.

## 2017-05-24 IMAGING — MR MR SHOULDER*L* W/O CM
5 series · 40 of 40 positions shown · non-contrast
Comparison: Radiographs dated 04/26/2015

CLINICAL DATA: Left shoulder pain and weakness with limited range
of motion.

EXAM:
MRI OF THE LEFT SHOULDER WITHOUT CONTRAST
TECHNIQUE: Multiplanar, multisequence MR imaging of the shoulder was performed.
No intravenous contrast was administered.

[Series 7: T2 fat-sat · axial · left · 3.0mm · 0.44mm/px · z∈[-100,+6]mm · 8 of 30 slices shown (1 of 3)]
[im 1/30]
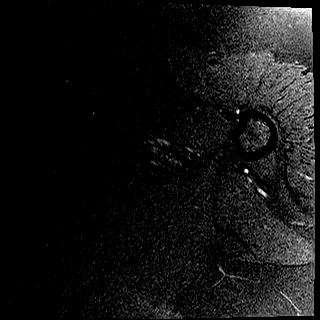
[im 5/30]
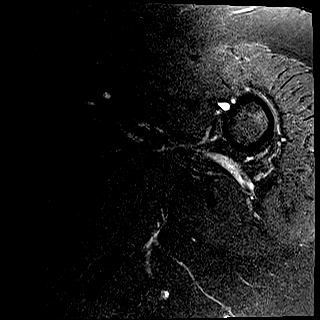
[im 9/30]
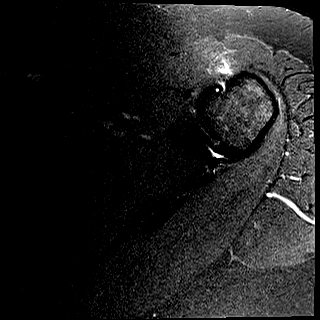
[im 13/30]
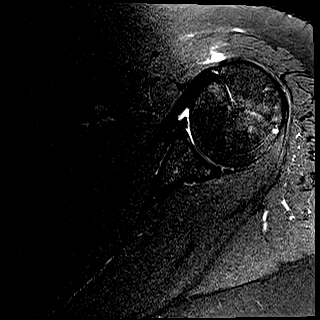
[im 17/30]
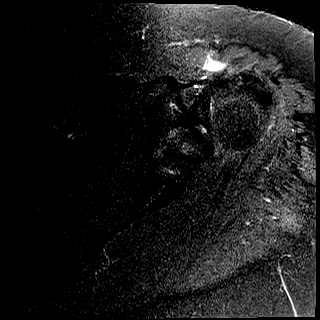
[im 21/30]
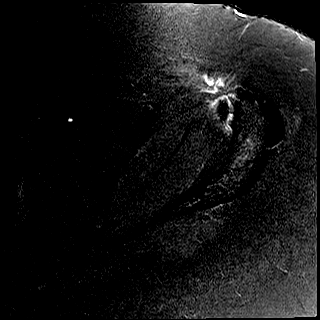
[im 25/30]
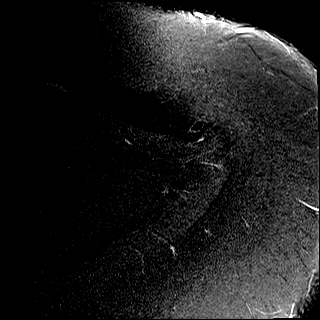
[im 30/30]
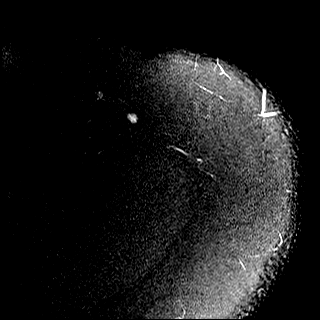

[Series 8: PD fat-sat · oblique · left · 3.0mm · 0.44mm/px · 8 of 25 slices shown]
[im 1/25]
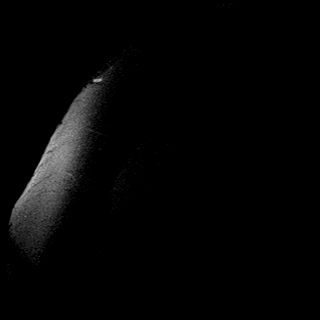
[im 4/25]
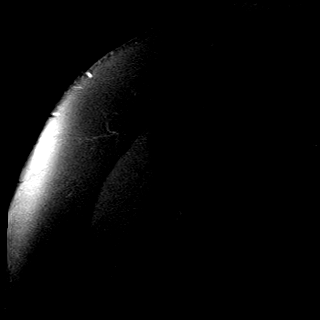
[im 7/25]
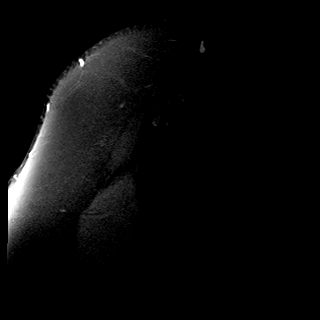
[im 11/25]
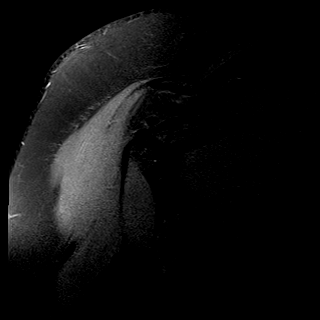
[im 14/25]
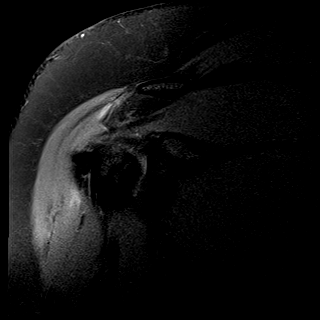
[im 18/25]
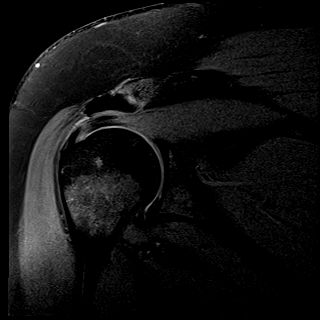
[im 21/25]
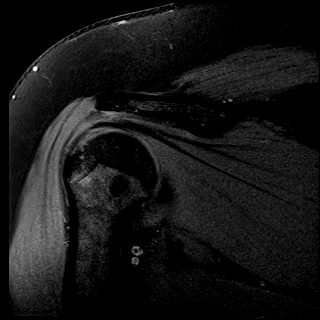
[im 25/25]
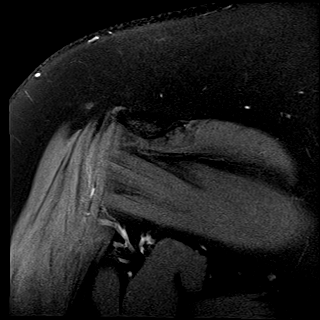

[Series 9: T2 fat-sat · oblique · left · 3.0mm · 0.44mm/px · 8 of 25 slices shown (2 of 3)]
[im 1/25]
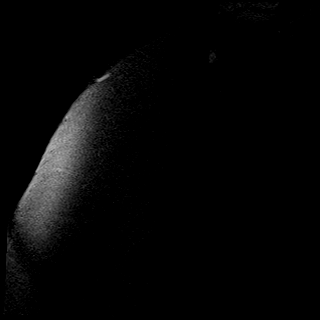
[im 4/25]
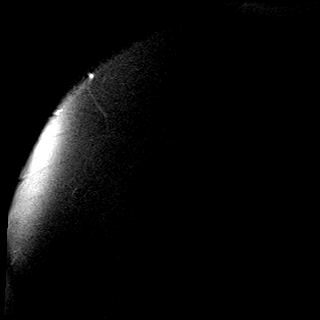
[im 7/25]
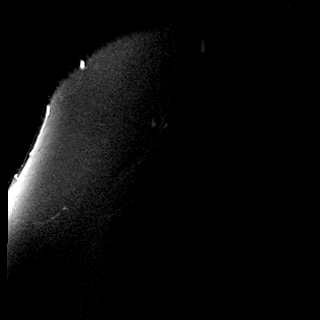
[im 11/25]
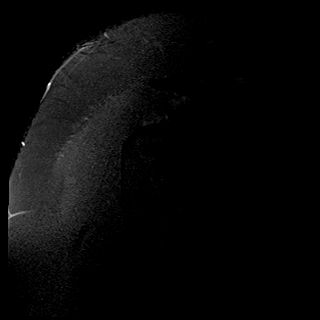
[im 14/25]
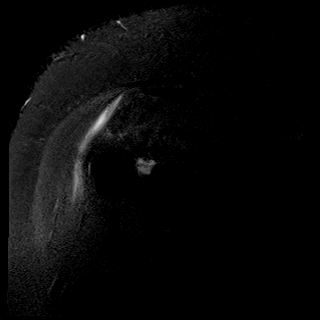
[im 18/25]
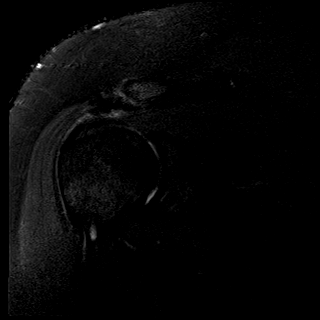
[im 21/25]
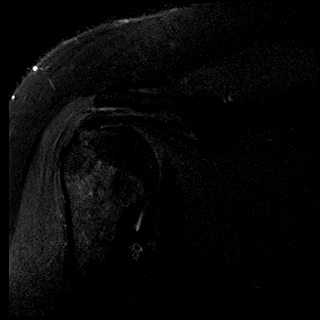
[im 25/25]
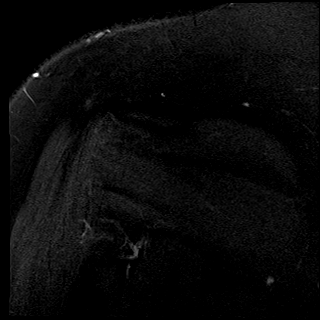

[Series 10: T1 · oblique · left · 3.0mm · 0.36mm/px · 8 of 25 slices shown]
[im 1/25]
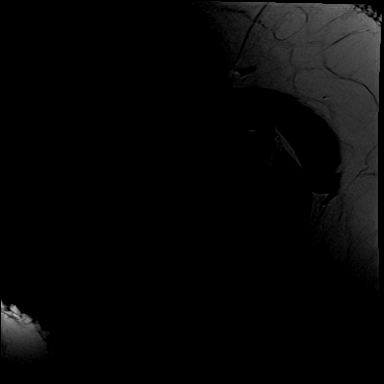
[im 4/25]
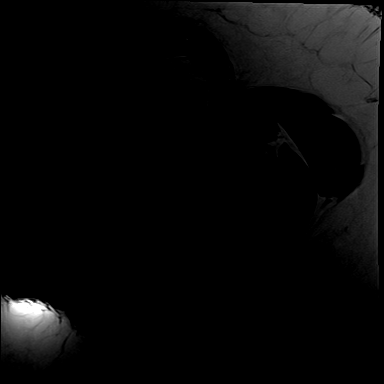
[im 7/25]
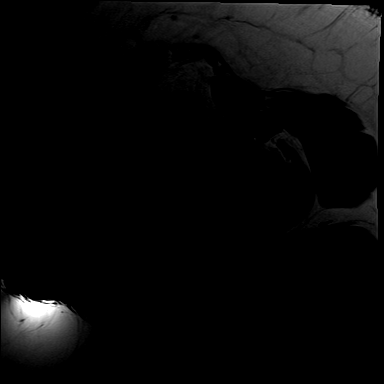
[im 11/25]
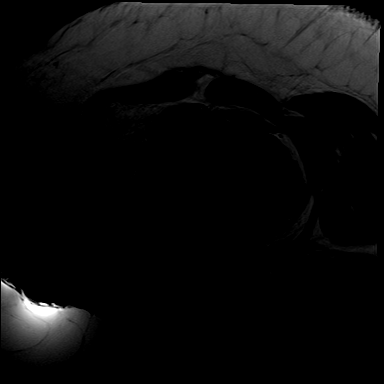
[im 14/25]
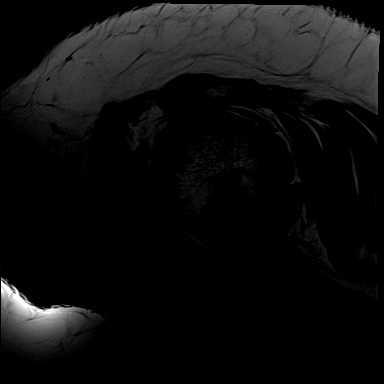
[im 18/25]
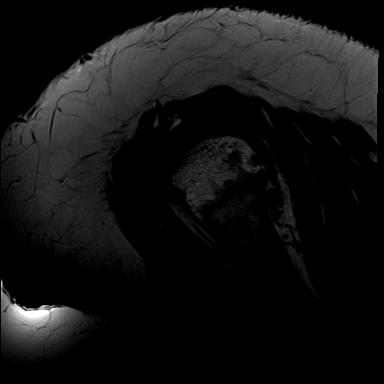
[im 21/25]
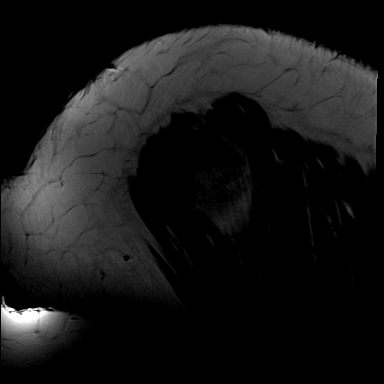
[im 25/25]
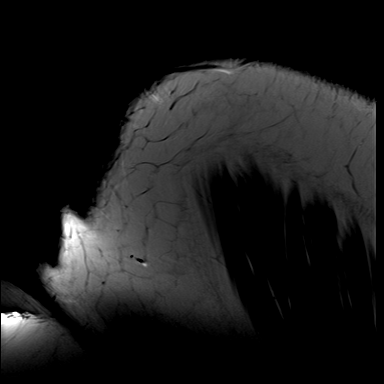

[Series 11: T2 fat-sat · oblique · left · 3.0mm · 0.44mm/px · 8 of 25 slices shown (3 of 3)]
[im 1/25]
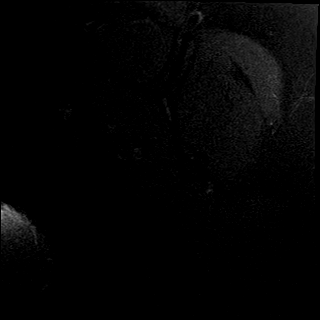
[im 4/25]
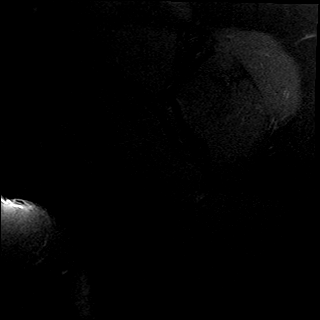
[im 7/25]
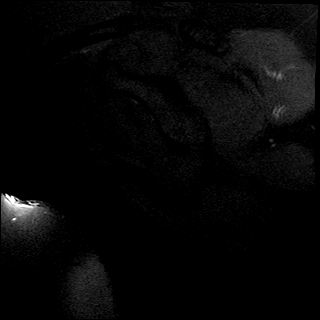
[im 11/25]
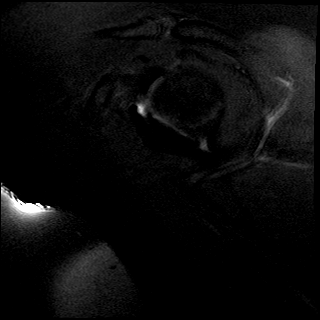
[im 14/25]
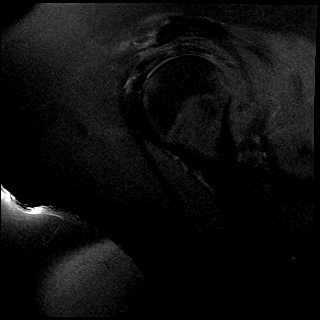
[im 18/25]
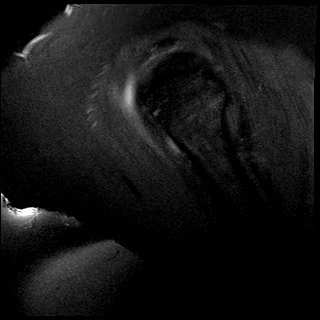
[im 21/25]
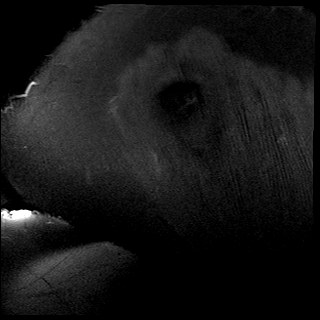
[im 25/25]
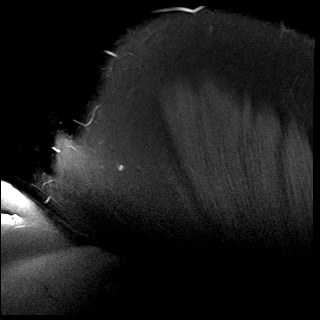

[40 of 40 positions shown; findings below may reference images not displayed]

FINDINGS: Rotator cuff:  Normal.

Muscles: There is a linear intrasubstance tear of the anterior
aspect of the deltoid muscle measuring 6 mm in width in 6 cm in
length originating from the anterior aspect of the distal clavicle
extending inferiorly anterior to the humeral head in the biceps
tendon. The muscles are otherwise normal.

Biceps long head:  Properly located and intact.

Acromioclavicular Joint:  Normal.  Type 2 acromion.  No bursitis.

Glenohumeral Joint: Normal.

Labrum:  Normal.

Bones:  Normal.
IMPRESSION: Linear intrasubstance tear of the anterior aspect of the deltoid
muscle I suspect this is relatively acute. Otherwise, normal exam.
